# Patient Record
Sex: Female | Born: 1950 | Race: Black or African American | Hispanic: No | Marital: Married | Smoking: Former smoker
Health system: Southern US, Community
[De-identification: ages and names within clinical notes are randomized; demographics above are authoritative.]

## PROBLEM LIST (undated history)

## (undated) DIAGNOSIS — F419 Anxiety disorder, unspecified: Secondary | ICD-10-CM

## (undated) HISTORY — PX: BREAST EXCISIONAL BIOPSY: SUR124

## (undated) HISTORY — DX: Anxiety disorder, unspecified: F41.9

---

## 1991-12-14 HISTORY — PX: ABDOMINAL HYSTERECTOMY: SHX81

## 2010-12-13 DIAGNOSIS — I10 Essential (primary) hypertension: Secondary | ICD-10-CM

## 2010-12-13 DIAGNOSIS — E785 Hyperlipidemia, unspecified: Secondary | ICD-10-CM

## 2010-12-13 HISTORY — DX: Hyperlipidemia, unspecified: E78.5

## 2010-12-13 HISTORY — DX: Essential (primary) hypertension: I10

## 2015-12-14 DIAGNOSIS — C801 Malignant (primary) neoplasm, unspecified: Secondary | ICD-10-CM

## 2015-12-14 DIAGNOSIS — C50919 Malignant neoplasm of unspecified site of unspecified female breast: Secondary | ICD-10-CM

## 2015-12-14 DIAGNOSIS — Z923 Personal history of irradiation: Secondary | ICD-10-CM

## 2015-12-14 DIAGNOSIS — Z9221 Personal history of antineoplastic chemotherapy: Secondary | ICD-10-CM

## 2015-12-14 DIAGNOSIS — K219 Gastro-esophageal reflux disease without esophagitis: Secondary | ICD-10-CM

## 2015-12-14 HISTORY — DX: Gastro-esophageal reflux disease without esophagitis: K21.9

## 2015-12-14 HISTORY — PX: BREAST LUMPECTOMY: SHX2

## 2015-12-14 HISTORY — DX: Personal history of irradiation: Z92.3

## 2015-12-14 HISTORY — DX: Malignant (primary) neoplasm, unspecified: C80.1

## 2015-12-14 HISTORY — DX: Malignant neoplasm of unspecified site of unspecified female breast: C50.919

## 2015-12-14 HISTORY — DX: Personal history of antineoplastic chemotherapy: Z92.21

## 2018-02-03 DIAGNOSIS — R42 Dizziness and giddiness: Secondary | ICD-10-CM | POA: Insufficient documentation

## 2018-02-03 DIAGNOSIS — H9319 Tinnitus, unspecified ear: Secondary | ICD-10-CM | POA: Insufficient documentation

## 2019-12-14 DIAGNOSIS — M1990 Unspecified osteoarthritis, unspecified site: Secondary | ICD-10-CM

## 2019-12-14 HISTORY — DX: Unspecified osteoarthritis, unspecified site: M19.90

## 2020-07-28 ENCOUNTER — Telehealth: Payer: Self-pay | Admitting: Hematology and Oncology

## 2020-07-28 NOTE — Telephone Encounter (Signed)
Received records from [facility] for hx of breast cancer. Jennifer Chan has been cld and scheduled to see Dr. Lindi Adie on 8/24 at 3pm. Pt aware to arrive 15 minutes early.

## 2020-08-04 NOTE — Progress Notes (Signed)
[facility] CONSULT NOTE  Patient Care Team: System, Pcp Not In as PCP - General  CHIEF COMPLAINTS/PURPOSE OF CONSULTATION:  History of breast cancer  HISTORY OF PRESENTING ILLNESS:  Jennifer Chan 69 y.o. female is here because of a history of left breast cancer diagnosed in 2017. Patient palpated a left breast mass. 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 She underwent a left lumpectomy on 04/26/16 for which pathology showed invasive and in situ ductal carcinoma, 1.5cm, 3 axillary lymph nodes negative, positive margin resected on 09/14/16. She underwent adjuvant chemotherapy with weekly Taxol followed by Herceptin for a year. She began antiestrogen therapy with letrozole in January 2018, and eventually switched to anastrozole due to joint pain. She was under care at the [facility] in [place] and at [facility] in [place], [place]. She presents to the clinic today for initial evaluation.  Patient was living in [facility] and they decided to move to [place]  She denies any lumps or nodules in the breast.  I reviewed her records extensively and collaborated the history with the patient.  SUMMARY OF ONCOLOGIC HISTORY: Oncology History  Malignant neoplasm of upper-outer quadrant of left breast in female, estrogen receptor positive ([place])  04/26/2016 Initial Diagnosis   Patient palpated a left breast mass. 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 She underwent a left lumpectomy on 04/26/16 for which pathology showed invasive and in situ ductal carcinoma, 1.5cm, 3 axillary lymph nodes negative, positive margin resected on 09/14/16.    05/2016 - 05/2017 Chemotherapy   She underwent adjuvant chemotherapy with weekly Taxol followed by Herceptin for a year.    12/2016 -  Anti-estrogen oral therapy    She began antiestrogen therapy with letrozole in January 2018, and eventually switched to anastrozole due to joint pain. She was under care at the [facility] in [place] and at [facility] in [place], [place].     MEDICAL HISTORY:  Hypertension, hypercholesterolemia, breast cancer SURGICAL HISTORY: Breast conserving surgery SOCIAL HISTORY: Denies any tobacco alcohol or recreational drug use.  She quit smoking 12 years ago. FAMILY HISTORY: Mother died from Hodgkin's lymphoma.  ALLERGIES:  has no allergies on file.  MEDICATIONS:  Current Outpatient Medications  Medication Sig Dispense Refill  . amLODipine (NORVASC) 10 MG tablet Take 1 tablet (10 mg total) by mouth daily.    Marland Kitchen exemestane (AROMASIN) 25 MG tablet Take 1 tablet (25 mg total) by mouth daily after breakfast. 90 tablet 3  . metoprolol succinate (TOPROL-XL) 100 MG 24 hr tablet Take 1 tablet (100 mg total) by mouth daily. Take with or immediately following a meal.    . pravastatin (PRAVACHOL) 40 MG tablet Take 1 tablet (40 mg total) by mouth daily. 30 tablet   . Tragacanth (ASTRAGALUS ROOT) POWD 500 mg by Does not apply route daily.  0  . Vitamin D3 (VITAMIN D) 25 MCG tablet Take 1 tablet (1,000 Units total) by mouth daily. 60 tablet    No current facility-administered medications for this visit.    REVIEW OF SYSTEMS:   All other systems were reviewed with the patient and are negative.  PHYSICAL EXAMINATION: ECOG PERFORMANCE STATUS: 2 - Symptomatic, <50% confined to bed  Vitals:   08/05/20 1509  BP: (!) 163/56  Pulse: 68  Resp: 17  Temp: 97.7 F (  36.5 C)  SpO2: 99%   Filed Weights   08/05/20 1509  Weight: 153 lb 8 oz (69.6 kg)     BREAST: No palpable nodules in breast. No palpable axillary or supraclavicular lymphadenopathy (exam performed in the presence of a chaperone)   LABORATORY DATA:   RADIOGRAPHIC STUDIES: I have personally reviewed the radiological reports and agreed  with the findings in the report.  ASSESSMENT AND PLAN:  Malignant neoplasm of upper-outer quadrant of left breast in female, estrogen receptor positive ([place]) 03/19/2016: Palpable left breast mass revealed grade 2 IDC ER positive PR negative HER-2 3+ positive 04/26/2016: Left lumpectomy: IDC with DCIS 1.5 cm, 0/3 lymph nodes negative, positive margin resected on 09/14/2016 June 2017: Adjuvant chemo with Taxol Herceptin followed by Herceptin maintenance for 1 year January 2018: Initially letrozole switched to anastrozole (Treatment received at [facility] in [place] and [facility] in [place])  Current treatment: Anastrozole 1 mg daily Anastrozole toxicities: Denies any side effects to anastrozole  Breast cancer surveillance: 1.  Breast exam 08/05/2020: Benign 2. mammograms: July 2020 previous mammograms.  I will set her up at the breast center for her to get new mammograms this year.  Return to clinic in 1 year for follow-up. All questions were answered. The patient knows to call the clinic with any problems, questions or concerns.   Rulon Eisenmenger, MD, MPH 08/05/2020    I, Molly Dorshimer, am acting as scribe for Nicholas Lose, MD.  I have reviewed the above documentation for accuracy and completeness, and I agree with the above.

## 2020-08-05 ENCOUNTER — Other Ambulatory Visit: Payer: Self-pay

## 2020-08-05 ENCOUNTER — Inpatient Hospital Stay: Payer: Medicare HMO | Attending: Hematology and Oncology | Admitting: Hematology and Oncology

## 2020-08-05 DIAGNOSIS — C50412 Malignant neoplasm of upper-outer quadrant of left female breast: Secondary | ICD-10-CM

## 2020-08-05 DIAGNOSIS — I10 Essential (primary) hypertension: Secondary | ICD-10-CM | POA: Diagnosis not present

## 2020-08-05 DIAGNOSIS — E7800 Pure hypercholesterolemia, unspecified: Secondary | ICD-10-CM | POA: Insufficient documentation

## 2020-08-05 DIAGNOSIS — Z170 Estrogen receptor positive status [ER+]: Secondary | ICD-10-CM | POA: Diagnosis not present

## 2020-08-05 DIAGNOSIS — Z79811 Long term (current) use of aromatase inhibitors: Secondary | ICD-10-CM

## 2020-08-05 MED ORDER — VITAMIN D3 25 MCG PO TABS: 1000.0000 [IU] | ORAL_TABLET | Freq: Every day | ORAL | Status: AC

## 2020-08-05 MED ORDER — ASTRAGALUS ROOT POWD: 500.0000 mg | Freq: Every day | 0 refills | Status: AC

## 2020-08-05 MED ORDER — EXEMESTANE 25 MG PO TABS: 25.0000 mg | ORAL_TABLET | Freq: Every day | ORAL | 3 refills | Status: DC

## 2020-08-05 MED ORDER — AMLODIPINE BESYLATE 10 MG PO TABS: 10.0000 mg | ORAL_TABLET | Freq: Every day | ORAL | Status: AC

## 2020-08-05 MED ORDER — METOPROLOL SUCCINATE ER 100 MG PO TB24: 100.0000 mg | ORAL_TABLET | Freq: Every day | ORAL | Status: AC

## 2020-08-05 MED ORDER — PRAVASTATIN SODIUM 40 MG PO TABS: 40.0000 mg | ORAL_TABLET | Freq: Every day | ORAL | Status: AC

## 2020-08-05 NOTE — Assessment & Plan Note (Signed)
03/19/2016: Palpable left breast mass revealed grade 2 IDC ER positive PR negative HER-2 3+ positive 04/26/2016: Left lumpectomy: IDC with DCIS 1.5 cm, 0/3 lymph nodes negative, positive margin resected on 09/14/2016 June 2017: Adjuvant chemo with Taxol Herceptin followed by Herceptin maintenance for 1 year January 2018: Initially letrozole switched to anastrozole (Treatment received at [facility] in [place] and [facility] in [place])  Current treatment: Anastrozole 1 mg daily Anastrozole toxicities:  Breast cancer surveillance: 1.  Breast exam 08/05/2020: Benign 2. mammograms

## 2020-08-12 ENCOUNTER — Other Ambulatory Visit: Payer: Self-pay

## 2020-08-12 ENCOUNTER — Encounter: Payer: Self-pay | Admitting: Family Medicine

## 2020-08-12 ENCOUNTER — Ambulatory Visit (INDEPENDENT_AMBULATORY_CARE_PROVIDER_SITE_OTHER): Payer: Medicare HMO | Admitting: Family Medicine

## 2020-08-12 VITALS — BP 142/62 | HR 61 | Ht 64.0 in | Wt 151.8 lb

## 2020-08-12 DIAGNOSIS — E785 Hyperlipidemia, unspecified: Secondary | ICD-10-CM | POA: Diagnosis not present

## 2020-08-12 DIAGNOSIS — Z7689 Persons encountering health services in other specified circumstances: Secondary | ICD-10-CM

## 2020-08-12 DIAGNOSIS — I10 Essential (primary) hypertension: Secondary | ICD-10-CM | POA: Diagnosis not present

## 2020-08-12 DIAGNOSIS — E782 Mixed hyperlipidemia: Secondary | ICD-10-CM | POA: Diagnosis not present

## 2020-08-12 NOTE — Patient Instructions (Addendum)
It was wonderful to meet you today! I will call you with lab results. Please let me know if you're having any issues with sleeping or being exhausted at night.   Take your blood pressure at home at the same time everyday. If the top number is over 140 and the bottom number is over 90 consistently, please come back in to adjust your blood pressure medications.

## 2020-08-12 NOTE — Progress Notes (Signed)
New Patient Visit Subjective:  Care Team:  [place], MD [phone] [id number]/22/21) [place] [phone] (08/08/19)  Enrique Sack, PCP [phone] (2019)    CC: Establish care   HPI Jennifer Chan is a 69 y.o. female who presents today to establish care. She is new to the city and needed to establish care here. She also complains of feeling sore and tired at the end of the day for the last few months. She does report that she feels back to normal and refreshed in the morning.   HISTORY Reviewed with patient and updated in EMR as appropriate.  Allergies: none  Medications:  No Known Allergies  Current Outpatient Medications:  .  amLODipine (NORVASC) 10 MG tablet, Take 1 tablet (10 mg total) by mouth daily., Disp: , Rfl:  .  exemestane (AROMASIN) 25 MG tablet, Take 1 tablet (25 mg total) by mouth daily after breakfast., Disp: 90 tablet, Rfl: 3 .  metoprolol succinate (TOPROL-XL) 100 MG 24 hr tablet, Take 1 tablet (100 mg total) by mouth daily. Take with or immediately following a meal., Disp: , Rfl:  .  pravastatin (PRAVACHOL) 40 MG tablet, Take 1 tablet (40 mg total) by mouth daily., Disp: 30 tablet, Rfl:  .  Tragacanth (ASTRAGALUS ROOT) POWD, 500 mg by Does not apply route daily., Disp: , Rfl: 0 .  Vitamin D3 (VITAMIN D) 25 MCG tablet, Take 1 tablet (1,000 Units total) by mouth daily., Disp: 60 tablet, Rfl:   Past Medical History:  Past Medical History:  Diagnosis Date  . Arthritis 2021  . Cancer ([place]) 2017  . GERD (gastroesophageal reflux disease) 2017  . Hyperlipidemia 2012  . Hypertension 2012   Past Surgical History:  Procedure Laterality Date  . ABDOMINAL HYSTERECTOMY  1993   comlete   OB/Gyn History:  Hysterectomy 1993 LMP: No LMP recorded. OB History   No obstetric history on file.      Health Maintenance:  Colonoscopy 2012 Mammogram 2021 (normal) Vaccines: PCV 2019 (unsure is 2 doses) DEXA 2021  Echocardiogram  2017  Stress test 2017   Eye doctor: referral provided  Dentist: list of local dentists provided    Family History Emmalyn's family history includes Alcohol abuse in her brother, maternal grandfather, maternal grandmother, mother, paternal grandfather, paternal grandmother, and sister; Heart disease in her mother; Hyperlipidemia in her brother, father, mother, and sister; Hypertension in her brother, father, mother, and sister; Non-Hodgkin's lymphoma in her mother; Stroke in her brother.   Social History  Anneth's  has no history on file for tobacco use, alcohol use, and drug use..  Social History   Social History Narrative   Patient recently moved here from [facility] because she wanted to be in a more city-like area. She reports where she used to live, she had to drive hours to get to a store.  She lives with her husband Jennifer Chan).  She owns a car.  She does not have any religious or personal beliefs that affect her health care.  She does not have an advanced directive.  Her husband and brothers would make medical decisions for her if she was unable to do so.  She does exercise regularly: Walking.  She used to use cigarettes and quit in January 2012.  She denies any recreational drug use and alcohol use.  She feels safe in her relationship.  .  Objective:  Physical Exam:  BP (!) 142/62   Pulse 61   Ht  5\' 4"  (1.626 m)   Wt 151 lb 12.8 oz (68.9 kg)   SpO2 97%   BMI 26.06 kg/m   Gen: NAD, alert, non-toxic, pleasant HEENT: Normocephaic, atraumatic. PERRLA, clear conjuctiva, no scleral icterus and injection. Normal EOM.  Hearing intact. TM pearly grey bilaterally with no fluid. Neck supple with no LAD, nodules, or gross abnormality.  Nares patent with no discharge.  Oropharynx without erythema and lesions.  Tonsils nonswollen and without exudate.   CV: Regular rate and rhythm.  Normal S1-S2.  No murmur, gallops, S3, S4 appreciated.  Normal capillary refill  bilaterally.  Radial pulses 2+ bilaterally. No bilateral lower extremity edema. Resp: Clear to auscultation bilaterally.  No wheezing, rales, rhonchi, or other abnormal lung sounds.  No increased work of breathing appreciated. Abd: Nontender and nondistended on palpation to all 4 quadrants.  Positive bowel sounds. Skin: No obvious rashes, lesions, or trauma.  Normal turgor.  MSK: Normal ROM. Normal strength and tone.  Neuro: Cranial nerves II through VI grossly intact. Gait normal.  Alert and oriented x4.  No obvious abnormal movements. Psych: Cooperative with exam.  Normal speech. Pleasant. Makes good eye contact. Genitourinary: deferred.   Assessment & Plan:  Encounter to establish care Reassured patient about tiredness and soreness at the end of the day. Likely due to recent move. Reassuring that patient feels back to normal in the mornings. Follow up if no improvement or worsening.   Hyperlipidemia Checking lipid panel today.  Patient taking pravastatin 40 mg daily.  Hypertension Blood pressure slightly elevated at 142/62 today.  Discussed checking blood pressures at home at the same time every day.  Patient will write down and if greater than 140/90, patient to follow-up.  Currently taking amlodipine 10 mg and metoprolol XL 100 mg daily.  No refills needed at this time.   Will update health maintenance once records are received.  Follow up:  Future Appointments  Date Time Provider [place]  08/05/2021  2:45 PM Nicholas Lose, MD [facility] None    Wilber Oliphant, MD 08/14/20, 5:05 AM

## 2020-08-13 LAB — LIPID PANEL
Chol/HDL Ratio: 3.3 ratio (ref 0.0–4.4)
Cholesterol, Total: 126 mg/dL (ref 100–199)
HDL: 38 mg/dL — ABNORMAL LOW (ref >39)
LDL Chol Calc (NIH): 65 mg/dL (ref 0–99)
Triglycerides: 131 mg/dL (ref 0–149)
VLDL Cholesterol Cal: 23 mg/dL (ref 5–40)

## 2020-08-14 ENCOUNTER — Encounter: Payer: Self-pay | Admitting: Family Medicine

## 2020-08-14 DIAGNOSIS — I10 Essential (primary) hypertension: Secondary | ICD-10-CM | POA: Insufficient documentation

## 2020-08-14 DIAGNOSIS — E785 Hyperlipidemia, unspecified: Secondary | ICD-10-CM | POA: Insufficient documentation

## 2020-08-14 DIAGNOSIS — Z7689 Persons encountering health services in other specified circumstances: Secondary | ICD-10-CM | POA: Insufficient documentation

## 2020-08-14 NOTE — Assessment & Plan Note (Signed)
Reassured patient about tiredness and soreness at the end of the day. Likely due to recent move. Reassuring that patient feels back to normal in the mornings. Follow up if no improvement or worsening.

## 2020-08-14 NOTE — Assessment & Plan Note (Signed)
Blood pressure slightly elevated at 142/62 today.  Discussed checking blood pressures at home at the same time every day.  Patient will write down and if greater than 140/90, patient to follow-up.  Currently taking amlodipine 10 mg and metoprolol XL 100 mg daily.  No refills needed at this time.

## 2020-08-14 NOTE — Assessment & Plan Note (Addendum)
Checking lipid panel today.  Patient taking pravastatin 40 mg daily.

## 2020-09-29 ENCOUNTER — Other Ambulatory Visit: Payer: Self-pay

## 2020-09-29 ENCOUNTER — Ambulatory Visit
Admission: RE | Admit: 2020-09-29 | Discharge: 2020-09-29 | Disposition: A | Discharge status: Home / Self Care | Payer: Medicare PPO | Source: Ambulatory Visit | Attending: Hematology and Oncology | Admitting: Hematology and Oncology

## 2020-09-29 DIAGNOSIS — C50412 Malignant neoplasm of upper-outer quadrant of left female breast: Secondary | ICD-10-CM

## 2020-09-29 DIAGNOSIS — Z170 Estrogen receptor positive status [ER+]: Secondary | ICD-10-CM

## 2020-09-30 ENCOUNTER — Ambulatory Visit: Payer: Medicare PPO

## 2020-10-07 ENCOUNTER — Ambulatory Visit: Payer: Medicare PPO

## 2020-10-10 ENCOUNTER — Ambulatory Visit (INDEPENDENT_AMBULATORY_CARE_PROVIDER_SITE_OTHER): Payer: Medicare PPO

## 2020-10-10 ENCOUNTER — Other Ambulatory Visit: Payer: Self-pay

## 2020-10-10 DIAGNOSIS — Z23 Encounter for immunization: Secondary | ICD-10-CM | POA: Diagnosis not present

## 2020-10-10 NOTE — Progress Notes (Signed)
Patient presents in Flu Clinic for Flu Vaccine.   Vaccine administered RD without complications.   See admin for details.

## 2021-01-20 DIAGNOSIS — H02822 Cysts of right lower eyelid: Secondary | ICD-10-CM | POA: Diagnosis not present

## 2021-01-20 DIAGNOSIS — H524 Presbyopia: Secondary | ICD-10-CM | POA: Diagnosis not present

## 2021-01-20 DIAGNOSIS — H5203 Hypermetropia, bilateral: Secondary | ICD-10-CM | POA: Diagnosis not present

## 2021-02-05 DIAGNOSIS — I10 Essential (primary) hypertension: Secondary | ICD-10-CM | POA: Diagnosis not present

## 2021-02-05 DIAGNOSIS — C50912 Malignant neoplasm of unspecified site of left female breast: Secondary | ICD-10-CM | POA: Diagnosis not present

## 2021-02-05 DIAGNOSIS — H4010X0 Unspecified open-angle glaucoma, stage unspecified: Secondary | ICD-10-CM | POA: Diagnosis not present

## 2021-02-05 DIAGNOSIS — E785 Hyperlipidemia, unspecified: Secondary | ICD-10-CM | POA: Diagnosis not present

## 2021-02-16 DIAGNOSIS — Z1211 Encounter for screening for malignant neoplasm of colon: Secondary | ICD-10-CM | POA: Diagnosis not present

## 2021-02-17 DIAGNOSIS — D485 Neoplasm of uncertain behavior of skin: Secondary | ICD-10-CM | POA: Diagnosis not present

## 2021-02-17 DIAGNOSIS — B078 Other viral warts: Secondary | ICD-10-CM | POA: Diagnosis not present

## 2021-03-02 DIAGNOSIS — H269 Unspecified cataract: Secondary | ICD-10-CM | POA: Diagnosis not present

## 2021-06-17 DIAGNOSIS — E782 Mixed hyperlipidemia: Secondary | ICD-10-CM | POA: Diagnosis not present

## 2021-06-17 DIAGNOSIS — M545 Low back pain, unspecified: Secondary | ICD-10-CM | POA: Diagnosis not present

## 2021-06-17 DIAGNOSIS — I10 Essential (primary) hypertension: Secondary | ICD-10-CM | POA: Diagnosis not present

## 2021-07-02 ENCOUNTER — Other Ambulatory Visit: Payer: Self-pay

## 2021-07-02 ENCOUNTER — Encounter (HOSPITAL_BASED_OUTPATIENT_CLINIC_OR_DEPARTMENT_OTHER): Payer: Self-pay | Admitting: *Deleted

## 2021-07-02 ENCOUNTER — Ambulatory Visit (HOSPITAL_COMMUNITY)
Admission: EM | Admit: 2021-07-02 | Discharge: 2021-07-02 | Disposition: A | Discharge status: Home / Self Care | Payer: Medicare HMO

## 2021-07-02 ENCOUNTER — Emergency Department (HOSPITAL_BASED_OUTPATIENT_CLINIC_OR_DEPARTMENT_OTHER)
Admission: EM | Admit: 2021-07-02 | Discharge: 2021-07-02 | Disposition: A | Discharge status: Home / Self Care | Payer: Medicare HMO | Attending: Emergency Medicine | Admitting: Emergency Medicine

## 2021-07-02 DIAGNOSIS — Z853 Personal history of malignant neoplasm of breast: Secondary | ICD-10-CM | POA: Diagnosis not present

## 2021-07-02 DIAGNOSIS — I10 Essential (primary) hypertension: Secondary | ICD-10-CM | POA: Diagnosis not present

## 2021-07-02 DIAGNOSIS — Z23 Encounter for immunization: Secondary | ICD-10-CM | POA: Diagnosis not present

## 2021-07-02 DIAGNOSIS — Z87891 Personal history of nicotine dependence: Secondary | ICD-10-CM | POA: Diagnosis not present

## 2021-07-02 DIAGNOSIS — W260XXA Contact with knife, initial encounter: Secondary | ICD-10-CM | POA: Insufficient documentation

## 2021-07-02 DIAGNOSIS — S6991XA Unspecified injury of right wrist, hand and finger(s), initial encounter: Secondary | ICD-10-CM | POA: Diagnosis present

## 2021-07-02 DIAGNOSIS — Z79899 Other long term (current) drug therapy: Secondary | ICD-10-CM | POA: Insufficient documentation

## 2021-07-02 DIAGNOSIS — S61212A Laceration without foreign body of right middle finger without damage to nail, initial encounter: Secondary | ICD-10-CM | POA: Insufficient documentation

## 2021-07-02 MED ORDER — TETANUS-DIPHTH-ACELL PERTUSSIS 5-2.5-18.5 LF-MCG/0.5 IM SUSY
0.5000 mL | PREFILLED_SYRINGE | Freq: Once | INTRAMUSCULAR | Status: AC
  Administered 2021-07-02: 0.5 mL via INTRAMUSCULAR
  Filled 2021-07-02: qty 0.5

## 2021-07-02 MED ORDER — LIDOCAINE-EPINEPHRINE (PF) 2 %-1:200000 IJ SOLN
20.0000 mL | Freq: Once | INTRAMUSCULAR | Status: AC
  Administered 2021-07-02: 20 mL
  Filled 2021-07-02: qty 20

## 2021-07-02 NOTE — ED Provider Notes (Signed)
[facility] EMERGENCY DEPT Provider Note   CSN: [id number] Arrival date & time: 07/02/21  1808     History Chief Complaint  Patient presents with   Finger Laceration    Jennifer Chan is a 70 y.o. female.  Patient c/o accidental laceration to right middle finger just pta today. Was reaching in drawer and distal phalanx was cut on sharp edge of knife. Patient w narrow-based flap to palmer aspect right middle finger distal phalanx. Minor bleeding stopped w pressure. Moderate, sharp, non radiating pain to area, constant. Denies other pain or injury. Last tetanus unknown. Is right hand dominant.   The history is provided by the patient.      Past Medical History:  Diagnosis Date   Arthritis 2021   Breast cancer ([place]) 2017   Left Breast Cancer   Cancer ([place]) 2017   GERD (gastroesophageal reflux disease) 2017   Hyperlipidemia 2012   Hypertension 2012   Personal history of chemotherapy 2017   Left Breast Cancer   Personal history of radiation therapy 2017   Left Breast Cancer    Patient Active Problem List   Diagnosis Date Noted   Encounter to establish care 08/14/2020   Hypertension    Hyperlipidemia    Malignant neoplasm of upper-outer quadrant of left breast in female, estrogen receptor positive ([place]) 08/05/2020    Past Surgical History:  Procedure Laterality Date   ABDOMINAL HYSTERECTOMY  1993   comlete   BREAST EXCISIONAL BIOPSY Left    BREAST EXCISIONAL BIOPSY Left    BREAST LUMPECTOMY Left 2017     OB History     Gravida  4   Para  2   Term      Preterm  1   AB  1   Living         SAB      IAB      Ectopic      Multiple      Live Births              Family History  Problem Relation Age of Onset   Alcohol abuse Mother    Non-Hodgkin's lymphoma Mother    Heart disease Mother    Hyperlipidemia Mother    Hypertension Mother    Hyperlipidemia Father    Hypertension Father    Alcohol abuse Sister     Hyperlipidemia Sister    Hypertension Sister    Alcohol abuse Brother    Hyperlipidemia Brother    Hypertension Brother    Stroke Brother    Alcohol abuse Maternal Grandmother    Alcohol abuse Maternal Grandfather    Alcohol abuse Paternal Grandmother    Alcohol abuse Paternal Grandfather     Social History   Tobacco Use   Smoking status: Former    Types: Cigarettes    Quit date: 12/14/2010    Years since quitting: 10.5   Smokeless tobacco: Never  Vaping Use   Vaping Use: Never used  Substance Use Topics   Alcohol use: Never   Drug use: Never    Home Medications Prior to Admission medications   Medication Sig Start Date End Date Taking? Authorizing Provider  amLODipine (NORVASC) 10 MG tablet Take 1 tablet (10 mg total) by mouth daily. 08/05/20  Yes Nicholas Lose, MD  exemestane (AROMASIN) 25 MG tablet Take 1 tablet (25 mg total) by mouth daily after breakfast. 08/05/20  Yes Nicholas Lose, MD  metoprolol succinate (TOPROL-XL) 100 MG 24 hr tablet Take 1 tablet (  100 mg total) by mouth daily. Take with or immediately following a meal. 08/05/20  Yes Nicholas Lose, MD  pravastatin (PRAVACHOL) 40 MG tablet Take 1 tablet (40 mg total) by mouth daily. 08/05/20  Yes Nicholas Lose, MD  Vitamin D3 (VITAMIN D) 25 MCG tablet Take 1 tablet (1,000 Units total) by mouth daily. 08/05/20  Yes Nicholas Lose, MD  Tragacanth (ASTRAGALUS ROOT) POWD 500 mg by Does not apply route daily. 08/05/20   Nicholas Lose, MD    Allergies    Patient has no known allergies.  Review of Systems   Review of Systems  Constitutional:  Negative for fever.  Musculoskeletal:        Finger tip injury  Skin:  Positive for wound.  Neurological:  Negative for numbness.   Physical Exam Updated Vital Signs BP (!) 185/63 (BP Location: Right Arm)   Pulse 83   Temp 98.2 F (36.8 C) (Oral)   Resp 20   Ht 1.6 m (5\' 3" )   Wt 72.6 kg   LMP  (LMP Unknown)   SpO2 97%   BMI 28.34 kg/m   Physical Exam Vitals and nursing  note reviewed.  Constitutional:      Appearance: Normal appearance. She is well-developed.  HENT:     Head: Atraumatic.     Nose: Nose normal.     Mouth/Throat:     Mouth: Mucous membranes are moist.  Eyes:     General: No scleral icterus.    Conjunctiva/sclera: Conjunctivae normal.  Neck:     Trachea: No tracheal deviation.  Cardiovascular:     Rate and Rhythm: Normal rate.     Pulses: Normal pulses.  Pulmonary:     Effort: Pulmonary effort is normal. No respiratory distress.  Genitourinary:    Comments: No cva tenderness.  Musculoskeletal:        General: No swelling.     Cervical back: Neck supple. No muscular tenderness.     Comments: Narrow-based flap (~2 mm on ulnar side of flap) type laceration to palmer aspect of distal phalanx/distal tip of right middle finger. Skin of flap is very pale. Nail intact. Normal flexon of digit.   Skin:    General: Skin is warm and dry.     Findings: No rash.  Neurological:     Mental Status: She is alert.     Comments: Alert, speech normal. Hand nvi.   Psychiatric:        Mood and Affect: Mood normal.    ED Results / Procedures / Treatments   Labs (all labs ordered are listed, but only abnormal results are displayed) Labs Reviewed - No data to display  EKG None  Radiology No results found.  Procedures .Marland KitchenLaceration Repair  Date/Time: 07/02/2021 11:27 PM Performed by: Lajean Saver, MD Authorized by: Lajean Saver, MD   Consent:    Consent given by:  Patient Anesthesia:    Anesthesia method:  Nerve block   Block location:  Digit block   Block anesthetic:  Lidocaine 2% w/o epi   Block technique:  Digit   Block outcome:  Anesthesia achieved Laceration details:    Location:  Finger   Finger location:  R long finger   Length (cm):  3 Exploration:    Wound exploration: entire depth of wound visualized     Wound extent: no foreign bodies/material noted     Contaminated: no   Treatment:    Area cleansed with:  Saline    Amount of cleaning:  Standard  Irrigation solution:  Sterile saline   Visualized foreign bodies/material removed: no     Debridement:  None   Undermining:  None Skin repair:    Repair method:  Sutures   Suture size:  4-0   Suture material:  Prolene   Suture technique:  Simple interrupted   Number of sutures:  6 Repair type:    Repair type:  Simple Post-procedure details:    Dressing:  Sterile dressing   Procedure completion:  Tolerated well, no immediate complications   Medications Ordered in ED Medications  lidocaine-EPINEPHrine (XYLOCAINE W/EPI) 2 %-1:200000 (PF) injection 20 mL (has no administration in time range)  Tdap (BOOSTRIX) injection 0.5 mL (has no administration in time range)    ED Course  I have reviewed the triage vital signs and the nursing notes.  Pertinent labs & imaging results that were available during my care of the patient were reviewed by me and considered in my medical decision making (see chart for details).    MDM Rules/Calculators/A&P                          Tetanus IM.  Reviewed nursing notes and prior charts for additional history.   Discussed w pt, that give narrow flap, pallor of skin, etc, that flap/skin may well die, and that I do recommend closure of wound/suturing. Pt is agreeable.   Wound sutured. Sterile dressing. Pt tolerated well.    Final Clinical Impression(s) / ED Diagnoses Final diagnoses:  None    Rx / DC Orders ED Discharge Orders     None        Lajean Saver, MD 07/02/21 2330

## 2021-07-02 NOTE — Discharge Instructions (Signed)
It was our pleasure to provide your ER care today - we hope that you feel better.  As we discussed, given the flap nature of wound w very narrow attachment to finger, the skin/flap may not survive, but in interim, will provide a good dressing to open wound.   Elevate hand/finger. Keep area very clean/dry. Take acetaminophen as need.   Have sutures removed, your doctor or urgent care, in one week.   Also, your blood pressure is high tonight - continue your meds, and follow up with primary care doctor in 1-2 weeks.   Return to ER if worse, new symptoms, severe pain, infection of wound (pus, spreading redness), or other concern.

## 2021-07-02 NOTE — ED Triage Notes (Signed)
Right middle finger laceration today.

## 2021-07-14 DIAGNOSIS — Z4802 Encounter for removal of sutures: Secondary | ICD-10-CM | POA: Diagnosis not present

## 2021-07-14 DIAGNOSIS — I10 Essential (primary) hypertension: Secondary | ICD-10-CM | POA: Diagnosis not present

## 2021-07-20 DIAGNOSIS — S6991XD Unspecified injury of right wrist, hand and finger(s), subsequent encounter: Secondary | ICD-10-CM | POA: Diagnosis not present

## 2021-07-20 DIAGNOSIS — I10 Essential (primary) hypertension: Secondary | ICD-10-CM | POA: Diagnosis not present

## 2021-07-20 DIAGNOSIS — Z4802 Encounter for removal of sutures: Secondary | ICD-10-CM | POA: Diagnosis not present

## 2021-07-21 DIAGNOSIS — S61212A Laceration without foreign body of right middle finger without damage to nail, initial encounter: Secondary | ICD-10-CM | POA: Diagnosis not present

## 2021-08-04 NOTE — Progress Notes (Signed)
Patient Care Team: System, Provider Not In as PCP - General  DIAGNOSIS:    ICD-10-CM   1. Malignant neoplasm of upper-outer quadrant of left breast in female, estrogen receptor positive ([place])  C50.412    Z17.0       SUMMARY OF ONCOLOGIC HISTORY: Oncology History  Malignant neoplasm of upper-outer quadrant of left breast in female, estrogen receptor positive ([place])  04/26/2016 Initial Diagnosis   Patient palpated a left breast mass. 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 She underwent a left lumpectomy on 04/26/16 for which pathology showed invasive and in situ ductal carcinoma, 1.5cm, 3 axillary lymph nodes negative, positive margin resected on 09/14/16.    05/2016 - 05/2017 Chemotherapy   She underwent adjuvant chemotherapy with weekly Taxol followed by Herceptin for a year.    12/2016 -  Anti-estrogen oral therapy   She began antiestrogen therapy with letrozole in January 2018, and eventually switched to anastrozole due to joint pain. She was under care at the [facility] in [place] and at [facility] in [place], [place].     CHIEF COMPLIANT: Follow-up of left breast cancer  INTERVAL HISTORY: Jennifer Chan is a 70 y.o. with above-mentioned history of left breast cancer. She presents to the clinic today for follow-up.  She has been on exemestane therapy and tells me that it is very expensive.  She has been tolerating it fairly well with exception of joint aches and stiffness.  ALLERGIES:  has No Known Allergies.  MEDICATIONS:  Current Outpatient Medications  Medication Sig Dispense Refill   amLODipine (NORVASC) 10 MG tablet Take 1 tablet (10 mg total) by mouth daily.     exemestane (AROMASIN) 25 MG tablet Take 1 tablet (25 mg total) by mouth daily after breakfast. 90 tablet 3   metoprolol succinate (TOPROL-XL) 100 MG 24 hr tablet Take 1 tablet (100 mg total) by mouth daily. Take with or immediately  following a meal.     pravastatin (PRAVACHOL) 40 MG tablet Take 1 tablet (40 mg total) by mouth daily. 30 tablet    Tragacanth (ASTRAGALUS ROOT) POWD 500 mg by Does not apply route daily.  0   Vitamin D3 (VITAMIN D) 25 MCG tablet Take 1 tablet (1,000 Units total) by mouth daily. 60 tablet    No current facility-administered medications for this visit.    PHYSICAL EXAMINATION: ECOG PERFORMANCE STATUS: 1 - Symptomatic but completely ambulatory  Vitals:   08/05/21 1458  BP: (!) 172/53  Pulse: 76  Resp: 18  Temp: 98.3 F (36.8 C)  SpO2: 100%   Filed Weights   08/05/21 1458  Weight: 158 lb 8 oz (71.9 kg)    BREAST: No palpable masses or nodules in either right or left breasts. No palpable axillary supraclavicular or infraclavicular adenopathy no breast tenderness or nipple discharge. (exam performed in the presence of a chaperone)  ASSESSMENT & PLAN:  Malignant neoplasm of upper-outer quadrant of left breast in female, estrogen receptor positive ([place]) 03/19/2016: Palpable left breast mass revealed grade 2 IDC ER positive PR negative HER-2 3+ positive 04/26/2016: Left lumpectomy: IDC with DCIS 1.5 cm, 0/3 lymph nodes negative, positive margin resected on 09/14/2016 June 2017: Adjuvant chemo with Taxol Herceptin followed by Herceptin maintenance for 1 year January 2018: Initially letrozole switched to anastrozole switched to exemestane (Treatment received at [facility] in [place] and [facility] in [place])   Current treatment: Currently  on exemestane but we will be switching her to letrozole because of high cost of exemestane.  I recommended that she continue this for 2 more years.     Breast cancer surveillance: 1.  Breast exam 08/05/2021: Benign 2. mammograms: 09/29/2020: Benign breast density category B.   Return to clinic in 1 year for follow-up.    No orders of the defined types were placed in this encounter.  The patient  has a good understanding of the overall plan. she agrees with it. she will call with any problems that may develop before the next visit here.  Total time spent: 20 mins including face to face time and time spent for planning, charting and coordination of care  Rulon Eisenmenger, MD, MPH 08/05/2021  I, Thana Ates, am acting as scribe for Dr. Nicholas Lose.  I have reviewed the above documentation for accuracy and completeness, and I agree with the above.

## 2021-08-04 NOTE — Assessment & Plan Note (Signed)
03/19/2016: Palpable left breast mass revealed grade 2 IDC ER positive PR negative HER-2 3+ positive 04/26/2016: Left lumpectomy: IDC with DCIS 1.5 cm, 0/3 lymph nodes negative, positive margin resected on 09/14/2016 June 2017: Adjuvant chemo with Taxol Herceptin followed by Herceptin maintenance for 1 year January 2018: Initially letrozole switched to anastrozole (Treatment received at [facility] in [place] and [facility] in [place])  Current treatment: Anastrozole 1 mg daily Anastrozole toxicities: Denies any side effects to anastrozole  Breast cancer surveillance: 1.  Breast exam 08/05/2021: Benign 2. mammograms: 09/29/2020: Benign breast density category B.  Return to clinic in 1 year for follow-up.

## 2021-08-05 ENCOUNTER — Other Ambulatory Visit: Payer: Self-pay

## 2021-08-05 ENCOUNTER — Inpatient Hospital Stay: Payer: Medicare HMO | Attending: Hematology and Oncology | Admitting: Hematology and Oncology

## 2021-08-05 DIAGNOSIS — C50412 Malignant neoplasm of upper-outer quadrant of left female breast: Secondary | ICD-10-CM | POA: Diagnosis not present

## 2021-08-05 DIAGNOSIS — Z79811 Long term (current) use of aromatase inhibitors: Secondary | ICD-10-CM | POA: Insufficient documentation

## 2021-08-05 DIAGNOSIS — Z170 Estrogen receptor positive status [ER+]: Secondary | ICD-10-CM | POA: Diagnosis not present

## 2021-08-05 MED ORDER — LETROZOLE 2.5 MG PO TABS: 2.5000 mg | ORAL_TABLET | Freq: Every day | ORAL | 3 refills | Status: DC

## 2021-08-09 ENCOUNTER — Other Ambulatory Visit: Payer: Self-pay | Admitting: Hematology and Oncology

## 2021-08-11 DIAGNOSIS — S61212A Laceration without foreign body of right middle finger without damage to nail, initial encounter: Secondary | ICD-10-CM | POA: Diagnosis not present

## 2021-08-18 DIAGNOSIS — E782 Mixed hyperlipidemia: Secondary | ICD-10-CM | POA: Diagnosis not present

## 2021-08-18 DIAGNOSIS — I10 Essential (primary) hypertension: Secondary | ICD-10-CM | POA: Diagnosis not present

## 2021-08-20 ENCOUNTER — Other Ambulatory Visit: Payer: Self-pay | Admitting: Hematology and Oncology

## 2021-08-20 ENCOUNTER — Other Ambulatory Visit: Payer: Self-pay | Admitting: Internal Medicine

## 2021-08-20 DIAGNOSIS — Z1231 Encounter for screening mammogram for malignant neoplasm of breast: Secondary | ICD-10-CM

## 2021-08-25 DIAGNOSIS — R7303 Prediabetes: Secondary | ICD-10-CM | POA: Diagnosis not present

## 2021-08-25 DIAGNOSIS — E782 Mixed hyperlipidemia: Secondary | ICD-10-CM | POA: Diagnosis not present

## 2021-08-25 DIAGNOSIS — Z23 Encounter for immunization: Secondary | ICD-10-CM | POA: Diagnosis not present

## 2021-08-25 DIAGNOSIS — E049 Nontoxic goiter, unspecified: Secondary | ICD-10-CM | POA: Diagnosis not present

## 2021-08-25 DIAGNOSIS — H8110 Benign paroxysmal vertigo, unspecified ear: Secondary | ICD-10-CM | POA: Diagnosis not present

## 2021-08-25 DIAGNOSIS — I10 Essential (primary) hypertension: Secondary | ICD-10-CM | POA: Diagnosis not present

## 2021-08-25 DIAGNOSIS — Z0000 Encounter for general adult medical examination without abnormal findings: Secondary | ICD-10-CM | POA: Diagnosis not present

## 2021-10-05 ENCOUNTER — Other Ambulatory Visit: Payer: Self-pay

## 2021-10-05 ENCOUNTER — Ambulatory Visit
Admission: RE | Admit: 2021-10-05 | Discharge: 2021-10-05 | Disposition: A | Discharge status: Home / Self Care | Payer: Medicare HMO | Source: Ambulatory Visit | Attending: Internal Medicine | Admitting: Internal Medicine

## 2021-10-05 DIAGNOSIS — Z1231 Encounter for screening mammogram for malignant neoplasm of breast: Secondary | ICD-10-CM | POA: Diagnosis not present

## 2022-01-05 DIAGNOSIS — Z87891 Personal history of nicotine dependence: Secondary | ICD-10-CM | POA: Diagnosis not present

## 2022-01-05 DIAGNOSIS — I10 Essential (primary) hypertension: Secondary | ICD-10-CM | POA: Diagnosis not present

## 2022-01-05 DIAGNOSIS — Z803 Family history of malignant neoplasm of breast: Secondary | ICD-10-CM | POA: Diagnosis not present

## 2022-01-05 DIAGNOSIS — H269 Unspecified cataract: Secondary | ICD-10-CM | POA: Diagnosis not present

## 2022-01-05 DIAGNOSIS — Z7722 Contact with and (suspected) exposure to environmental tobacco smoke (acute) (chronic): Secondary | ICD-10-CM | POA: Diagnosis not present

## 2022-01-05 DIAGNOSIS — E785 Hyperlipidemia, unspecified: Secondary | ICD-10-CM | POA: Diagnosis not present

## 2022-01-05 DIAGNOSIS — K08109 Complete loss of teeth, unspecified cause, unspecified class: Secondary | ICD-10-CM | POA: Diagnosis not present

## 2022-01-05 DIAGNOSIS — Z8249 Family history of ischemic heart disease and other diseases of the circulatory system: Secondary | ICD-10-CM | POA: Diagnosis not present

## 2022-01-05 DIAGNOSIS — Z823 Family history of stroke: Secondary | ICD-10-CM | POA: Diagnosis not present

## 2022-01-05 DIAGNOSIS — C50919 Malignant neoplasm of unspecified site of unspecified female breast: Secondary | ICD-10-CM | POA: Diagnosis not present

## 2022-01-05 DIAGNOSIS — Z79811 Long term (current) use of aromatase inhibitors: Secondary | ICD-10-CM | POA: Diagnosis not present

## 2022-01-05 DIAGNOSIS — Z833 Family history of diabetes mellitus: Secondary | ICD-10-CM | POA: Diagnosis not present

## 2022-02-17 DIAGNOSIS — E782 Mixed hyperlipidemia: Secondary | ICD-10-CM | POA: Diagnosis not present

## 2022-02-17 DIAGNOSIS — I10 Essential (primary) hypertension: Secondary | ICD-10-CM | POA: Diagnosis not present

## 2022-02-17 DIAGNOSIS — R7303 Prediabetes: Secondary | ICD-10-CM | POA: Diagnosis not present

## 2022-02-24 DIAGNOSIS — E049 Nontoxic goiter, unspecified: Secondary | ICD-10-CM | POA: Diagnosis not present

## 2022-02-24 DIAGNOSIS — I10 Essential (primary) hypertension: Secondary | ICD-10-CM | POA: Diagnosis not present

## 2022-02-24 DIAGNOSIS — M79601 Pain in right arm: Secondary | ICD-10-CM | POA: Diagnosis not present

## 2022-02-24 DIAGNOSIS — R7303 Prediabetes: Secondary | ICD-10-CM | POA: Diagnosis not present

## 2022-02-24 DIAGNOSIS — S46001A Unspecified injury of muscle(s) and tendon(s) of the rotator cuff of right shoulder, initial encounter: Secondary | ICD-10-CM | POA: Diagnosis not present

## 2022-02-24 DIAGNOSIS — E782 Mixed hyperlipidemia: Secondary | ICD-10-CM | POA: Diagnosis not present

## 2022-02-25 ENCOUNTER — Other Ambulatory Visit: Payer: Self-pay | Admitting: Internal Medicine

## 2022-03-04 ENCOUNTER — Ambulatory Visit
Admission: RE | Admit: 2022-03-04 | Discharge: 2022-03-04 | Disposition: A | Discharge status: Home / Self Care | Payer: Medicare HMO | Source: Ambulatory Visit | Attending: Internal Medicine | Admitting: Internal Medicine

## 2022-03-04 DIAGNOSIS — E010 Iodine-deficiency related diffuse (endemic) goiter: Secondary | ICD-10-CM | POA: Diagnosis not present

## 2022-03-04 DIAGNOSIS — E041 Nontoxic single thyroid nodule: Secondary | ICD-10-CM | POA: Diagnosis not present

## 2022-03-04 DIAGNOSIS — E049 Nontoxic goiter, unspecified: Secondary | ICD-10-CM

## 2022-03-10 DIAGNOSIS — E041 Nontoxic single thyroid nodule: Secondary | ICD-10-CM | POA: Diagnosis not present

## 2022-03-23 DIAGNOSIS — Z111 Encounter for screening for respiratory tuberculosis: Secondary | ICD-10-CM | POA: Diagnosis not present

## 2022-03-25 DIAGNOSIS — Z111 Encounter for screening for respiratory tuberculosis: Secondary | ICD-10-CM | POA: Diagnosis not present

## 2022-04-28 DIAGNOSIS — R7303 Prediabetes: Secondary | ICD-10-CM | POA: Diagnosis not present

## 2022-04-28 DIAGNOSIS — E782 Mixed hyperlipidemia: Secondary | ICD-10-CM | POA: Diagnosis not present

## 2022-04-28 DIAGNOSIS — I10 Essential (primary) hypertension: Secondary | ICD-10-CM | POA: Diagnosis not present

## 2022-05-20 DIAGNOSIS — I10 Essential (primary) hypertension: Secondary | ICD-10-CM | POA: Diagnosis not present

## 2022-05-20 DIAGNOSIS — R7303 Prediabetes: Secondary | ICD-10-CM | POA: Diagnosis not present

## 2022-07-13 ENCOUNTER — Telehealth: Payer: Self-pay | Admitting: Hematology and Oncology

## 2022-07-13 NOTE — Telephone Encounter (Signed)
Scheduled appointment per provider PAL. Patient is aware of the changes made to her upcoming appointment.  

## 2022-07-24 ENCOUNTER — Other Ambulatory Visit: Payer: Self-pay | Admitting: Hematology and Oncology

## 2022-08-05 ENCOUNTER — Inpatient Hospital Stay: Payer: Medicare HMO | Admitting: Hematology and Oncology

## 2022-08-09 ENCOUNTER — Inpatient Hospital Stay: Payer: Medicare HMO | Admitting: Hematology and Oncology

## 2022-08-10 ENCOUNTER — Telehealth: Payer: Self-pay | Admitting: Hematology and Oncology

## 2022-08-10 NOTE — Telephone Encounter (Signed)
Rescheduled appointment per patient via 8/28 staff message. Patient is aware of the changes made to her upcoming appointment.

## 2022-08-12 DIAGNOSIS — H40013 Open angle with borderline findings, low risk, bilateral: Secondary | ICD-10-CM | POA: Diagnosis not present

## 2022-08-12 DIAGNOSIS — H25813 Combined forms of age-related cataract, bilateral: Secondary | ICD-10-CM | POA: Diagnosis not present

## 2022-08-23 ENCOUNTER — Other Ambulatory Visit: Payer: Self-pay | Admitting: Internal Medicine

## 2022-08-23 DIAGNOSIS — Z1231 Encounter for screening mammogram for malignant neoplasm of breast: Secondary | ICD-10-CM

## 2022-09-02 ENCOUNTER — Ambulatory Visit: Payer: Medicare HMO | Admitting: Hematology and Oncology

## 2022-09-02 DIAGNOSIS — Z0000 Encounter for general adult medical examination without abnormal findings: Secondary | ICD-10-CM | POA: Diagnosis not present

## 2022-09-02 DIAGNOSIS — R5383 Other fatigue: Secondary | ICD-10-CM | POA: Diagnosis not present

## 2022-09-02 DIAGNOSIS — E782 Mixed hyperlipidemia: Secondary | ICD-10-CM | POA: Diagnosis not present

## 2022-09-02 DIAGNOSIS — R7303 Prediabetes: Secondary | ICD-10-CM | POA: Diagnosis not present

## 2022-09-02 DIAGNOSIS — I10 Essential (primary) hypertension: Secondary | ICD-10-CM | POA: Diagnosis not present

## 2022-09-02 DIAGNOSIS — Z23 Encounter for immunization: Secondary | ICD-10-CM | POA: Diagnosis not present

## 2022-09-02 NOTE — Progress Notes (Signed)
Patient Care Team: Merrilee Seashore, MD as PCP - General (Internal Medicine)  DIAGNOSIS:  Encounter Diagnosis  Name Primary?   Malignant neoplasm of upper-outer quadrant of left breast in female, estrogen receptor positive ([place])     SUMMARY OF ONCOLOGIC HISTORY: Oncology History  Malignant neoplasm of upper-outer quadrant of left breast in female, estrogen receptor positive ([place])  04/26/2016 Initial Diagnosis   Patient palpated a left breast mass. 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 She underwent a left lumpectomy on 04/26/16 for which pathology showed invasive and in situ ductal carcinoma, 1.5cm, 3 axillary lymph nodes negative, positive margin resected on 09/14/16.    05/2016 - 05/2017 Chemotherapy   She underwent adjuvant chemotherapy with weekly Taxol followed by Herceptin for a year.    12/2016 -  Anti-estrogen oral therapy   She began antiestrogen therapy with letrozole in January 2018, and eventually switched to anastrozole due to joint pain. She was under care at the [facility] in [place] and at [facility] in [place], [place].     CHIEF COMPLIANT: Follow-up of left breast cancer on letrozole    INTERVAL HISTORY: Jennifer Chan is a 71 y.o. with above-mentioned history of left breast cancer. She presents to the clinic today for follow-up. She states that she has some joint pain but she can tolerate it. Denies any other side effects or symptoms. She does have some tenderness around the breast. She does not get that much time for exercise. 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    ALLERGIES:  has No Known Allergies.  MEDICATIONS:  Current Outpatient Medications  Medication Sig Dispense Refill   amLODipine (NORVASC) 10 MG tablet Take 1 tablet (10 mg total) by mouth daily.     letrozole (FEMARA) 2.5 MG tablet TAKE 1 TABLET BY MOUTH EVERY DAY 90 tablet 1    metoprolol succinate (TOPROL-XL) 100 MG 24 hr tablet Take 1 tablet (100 mg total) by mouth daily. Take with or immediately following a meal.     pravastatin (PRAVACHOL) 40 MG tablet Take 1 tablet (40 mg total) by mouth daily. 30 tablet    Tragacanth (ASTRAGALUS ROOT) POWD 500 mg by Does not apply route daily.  0   Vitamin D3 (VITAMIN D) 25 MCG tablet Take 1 tablet (1,000 Units total) by mouth daily. 60 tablet    No current facility-administered medications for this visit.    PHYSICAL EXAMINATION: ECOG PERFORMANCE STATUS: 1 - Symptomatic but completely ambulatory  Vitals:   09/03/22 0957  BP: (!) 166/74  Pulse: 74  Resp: 18  Temp: (!) 97.2 F (36.2 C)  SpO2: 98%   Filed Weights   09/03/22 0957  Weight: 158 lb 3.2 oz (71.8 kg)    BREAST: No palpable masses or nodules in either right or left breasts. No palpable axillary supraclavicular or infraclavicular adenopathy no breast tenderness or nipple discharge. (exam performed in the presence of a chaperone)  ASSESSMENT & PLAN:  Malignant neoplasm of upper-outer quadrant of left breast in female, estrogen receptor positive ([place]) 03/19/2016: Palpable left breast mass revealed grade 2 IDC ER positive PR negative HER-2 3+ positive 04/26/2016: Left lumpectomy: IDC with DCIS 1.5 cm, 0/3 lymph nodes negative, positive margin resected on 09/14/2016 June 2017: Adjuvant chemo with Taxol Herceptin followed by Herceptin maintenance for 1 year January 2018: Initially letrozole switched to anastrozole switched to exemestane, switched to letrozole 2022 (Treatment  received at [facility] in [place] and [facility] in [place])   Current treatment:  Switched from exemestane to letrozole because of high cost  Letrozole toxicities:  1.  Intermittent joint stiffness and achiness 2. occasional hot flashes Plan to treat her for 1 more year with antiestrogen therapy   Breast cancer surveillance: 1.  Breast  exam 09/03/2022: Benign 2. mammograms:  10/05/2021: Benign breast density category B.   Return to clinic in 1 year for follow-up and that we will complete 7 years of antiestrogen therapy.    No orders of the defined types were placed in this encounter.  The patient has a good understanding of the overall plan. she agrees with it. she will call with any problems that may develop before the next visit here. Total time spent: 30 mins including face to face time and time spent for planning, charting and co-ordination of care   Harriette Ohara, MD 09/03/22    I Gardiner Coins am scribing for Dr. Lindi Adie  I have reviewed the above documentation for accuracy and completeness, and I agree with the above.

## 2022-09-03 ENCOUNTER — Inpatient Hospital Stay: Payer: Medicare HMO | Attending: Hematology and Oncology | Admitting: Hematology and Oncology

## 2022-09-03 ENCOUNTER — Other Ambulatory Visit: Payer: Self-pay

## 2022-09-03 DIAGNOSIS — Z9221 Personal history of antineoplastic chemotherapy: Secondary | ICD-10-CM | POA: Insufficient documentation

## 2022-09-03 DIAGNOSIS — Z170 Estrogen receptor positive status [ER+]: Secondary | ICD-10-CM | POA: Diagnosis not present

## 2022-09-03 DIAGNOSIS — Z79811 Long term (current) use of aromatase inhibitors: Secondary | ICD-10-CM | POA: Diagnosis not present

## 2022-09-03 DIAGNOSIS — C50412 Malignant neoplasm of upper-outer quadrant of left female breast: Secondary | ICD-10-CM | POA: Insufficient documentation

## 2022-09-03 NOTE — Assessment & Plan Note (Addendum)
03/19/2016: Palpable left breast mass revealed grade 2 IDC ER positive PR negative HER-2 3+ positive 04/26/2016: Left lumpectomy: IDC with DCIS 1.5 cm, 0/3 lymph nodes negative, positive margin resected on 09/14/2016 June 2017: Adjuvant chemo with Taxol Herceptin followed by Herceptin maintenance for 1 year January 2018: Initially letrozole switched to anastrozole switched to exemestane, switched to letrozole 2022 ([facility] [place] and [facility] in [place])  Current treatment:  Switched from exemestane to letrozole because of high cost  Letrozole toxicities:  1.  Intermittent joint stiffness and achiness 2. occasional hot flashes Plan to treat her for 1 more year with antiestrogen therapy  Breast cancer surveillance: 1.Breast exam 09/03/2022: Benign 2.mammograms:  10/05/2021: Benign breast density category B.  Return to clinic in 1 year for follow-up and that we will complete 7 years of antiestrogen therapy. 

## 2022-09-09 DIAGNOSIS — R7303 Prediabetes: Secondary | ICD-10-CM | POA: Diagnosis not present

## 2022-09-09 DIAGNOSIS — C50412 Malignant neoplasm of upper-outer quadrant of left female breast: Secondary | ICD-10-CM | POA: Diagnosis not present

## 2022-09-09 DIAGNOSIS — E782 Mixed hyperlipidemia: Secondary | ICD-10-CM | POA: Diagnosis not present

## 2022-09-09 DIAGNOSIS — Z23 Encounter for immunization: Secondary | ICD-10-CM | POA: Diagnosis not present

## 2022-09-09 DIAGNOSIS — I10 Essential (primary) hypertension: Secondary | ICD-10-CM | POA: Diagnosis not present

## 2022-09-09 DIAGNOSIS — Z0000 Encounter for general adult medical examination without abnormal findings: Secondary | ICD-10-CM | POA: Diagnosis not present

## 2022-09-09 DIAGNOSIS — E041 Nontoxic single thyroid nodule: Secondary | ICD-10-CM | POA: Diagnosis not present

## 2022-09-30 ENCOUNTER — Ambulatory Visit: Payer: Medicare HMO

## 2022-11-02 ENCOUNTER — Other Ambulatory Visit: Payer: Self-pay | Admitting: Hematology and Oncology

## 2022-11-17 ENCOUNTER — Ambulatory Visit
Admission: RE | Admit: 2022-11-17 | Discharge: 2022-11-17 | Disposition: A | Discharge status: Home / Self Care | Payer: Medicare HMO | Source: Ambulatory Visit | Attending: Internal Medicine | Admitting: Internal Medicine

## 2022-11-17 DIAGNOSIS — Z1231 Encounter for screening mammogram for malignant neoplasm of breast: Secondary | ICD-10-CM | POA: Diagnosis not present

## 2023-03-17 DIAGNOSIS — I10 Essential (primary) hypertension: Secondary | ICD-10-CM | POA: Diagnosis not present

## 2023-03-17 DIAGNOSIS — E782 Mixed hyperlipidemia: Secondary | ICD-10-CM | POA: Diagnosis not present

## 2023-03-17 DIAGNOSIS — R7303 Prediabetes: Secondary | ICD-10-CM | POA: Diagnosis not present

## 2023-03-23 ENCOUNTER — Other Ambulatory Visit: Payer: Self-pay | Admitting: Internal Medicine

## 2023-03-23 DIAGNOSIS — E041 Nontoxic single thyroid nodule: Secondary | ICD-10-CM

## 2023-03-28 DIAGNOSIS — I10 Essential (primary) hypertension: Secondary | ICD-10-CM | POA: Diagnosis not present

## 2023-03-28 DIAGNOSIS — C50412 Malignant neoplasm of upper-outer quadrant of left female breast: Secondary | ICD-10-CM | POA: Diagnosis not present

## 2023-03-28 DIAGNOSIS — E782 Mixed hyperlipidemia: Secondary | ICD-10-CM | POA: Diagnosis not present

## 2023-03-28 DIAGNOSIS — Z117 Encounter for testing for latent tuberculosis infection: Secondary | ICD-10-CM | POA: Diagnosis not present

## 2023-03-28 DIAGNOSIS — R7303 Prediabetes: Secondary | ICD-10-CM | POA: Diagnosis not present

## 2023-03-28 DIAGNOSIS — N182 Chronic kidney disease, stage 2 (mild): Secondary | ICD-10-CM | POA: Diagnosis not present

## 2023-03-28 DIAGNOSIS — E041 Nontoxic single thyroid nodule: Secondary | ICD-10-CM | POA: Diagnosis not present

## 2023-03-31 ENCOUNTER — Ambulatory Visit
Admission: RE | Admit: 2023-03-31 | Discharge: 2023-03-31 | Disposition: A | Discharge status: Home / Self Care | Payer: Medicare HMO | Source: Ambulatory Visit | Attending: Internal Medicine | Admitting: Internal Medicine

## 2023-03-31 DIAGNOSIS — E042 Nontoxic multinodular goiter: Secondary | ICD-10-CM | POA: Diagnosis not present

## 2023-03-31 DIAGNOSIS — E041 Nontoxic single thyroid nodule: Secondary | ICD-10-CM

## 2023-04-21 DIAGNOSIS — Z825 Family history of asthma and other chronic lower respiratory diseases: Secondary | ICD-10-CM | POA: Diagnosis not present

## 2023-04-21 DIAGNOSIS — Z89022 Acquired absence of left finger(s): Secondary | ICD-10-CM | POA: Diagnosis not present

## 2023-04-21 DIAGNOSIS — E010 Iodine-deficiency related diffuse (endemic) goiter: Secondary | ICD-10-CM | POA: Diagnosis not present

## 2023-04-21 DIAGNOSIS — Z811 Family history of alcohol abuse and dependence: Secondary | ICD-10-CM | POA: Diagnosis not present

## 2023-04-21 DIAGNOSIS — Z79811 Long term (current) use of aromatase inhibitors: Secondary | ICD-10-CM | POA: Diagnosis not present

## 2023-04-21 DIAGNOSIS — Z823 Family history of stroke: Secondary | ICD-10-CM | POA: Diagnosis not present

## 2023-04-21 DIAGNOSIS — E785 Hyperlipidemia, unspecified: Secondary | ICD-10-CM | POA: Diagnosis not present

## 2023-04-21 DIAGNOSIS — M1990 Unspecified osteoarthritis, unspecified site: Secondary | ICD-10-CM | POA: Diagnosis not present

## 2023-04-21 DIAGNOSIS — I10 Essential (primary) hypertension: Secondary | ICD-10-CM | POA: Diagnosis not present

## 2023-04-21 DIAGNOSIS — Z008 Encounter for other general examination: Secondary | ICD-10-CM | POA: Diagnosis not present

## 2023-04-21 DIAGNOSIS — Z8249 Family history of ischemic heart disease and other diseases of the circulatory system: Secondary | ICD-10-CM | POA: Diagnosis not present

## 2023-04-21 DIAGNOSIS — Z87891 Personal history of nicotine dependence: Secondary | ICD-10-CM | POA: Diagnosis not present

## 2023-04-21 DIAGNOSIS — C50919 Malignant neoplasm of unspecified site of unspecified female breast: Secondary | ICD-10-CM | POA: Diagnosis not present

## 2023-05-23 IMAGING — MG MM DIGITAL SCREENING BILAT W/ TOMO AND CAD
6 of 10 series · 6 of 30 positions shown · non-contrast
Comparison: Previous exam(s).

CLINICAL DATA: Screening.

EXAM:
DIGITAL SCREENING BILATERAL MAMMOGRAM WITH TOMOSYNTHESIS AND CAD
TECHNIQUE: Bilateral screening digital craniocaudal and mediolateral oblique
mammograms were obtained. Bilateral screening digital breast
tomosynthesis was performed. The images were evaluated with
computer-aided detection.

[L MLO synth-2D (1 of 2)]
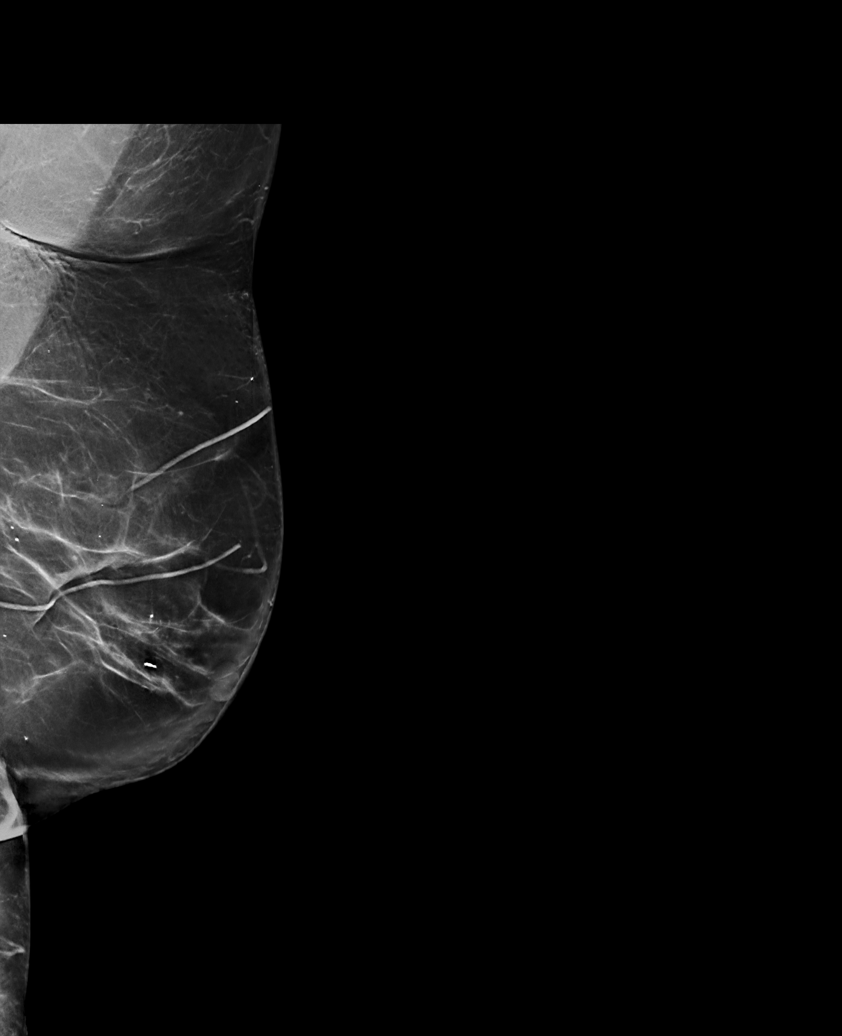

[L MLO synth-2D (2 of 2)]
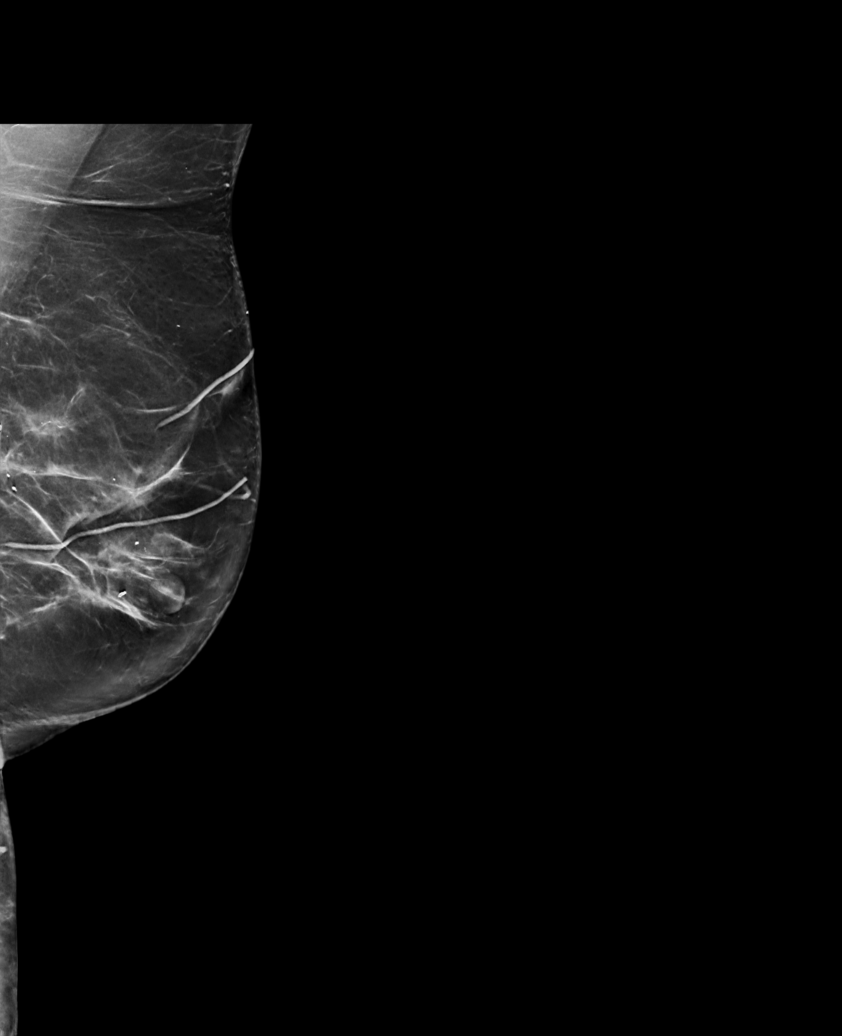

[R CC synth-2D]
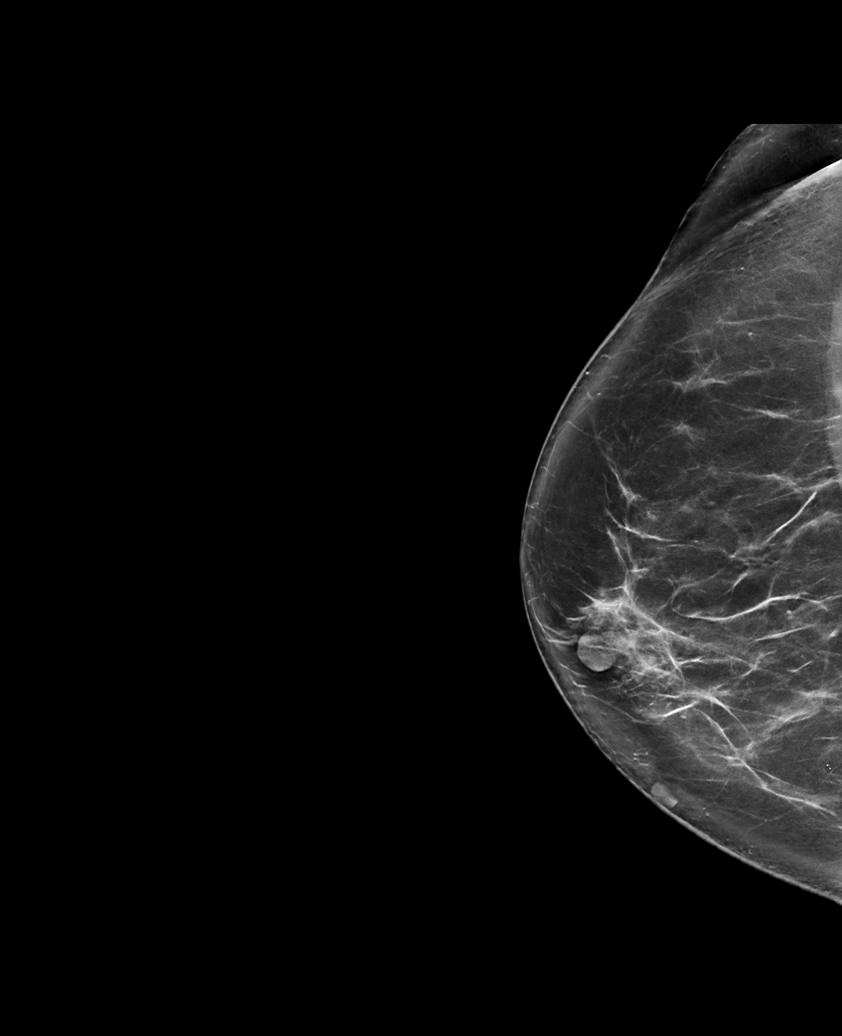

[R MLO synth-2D]
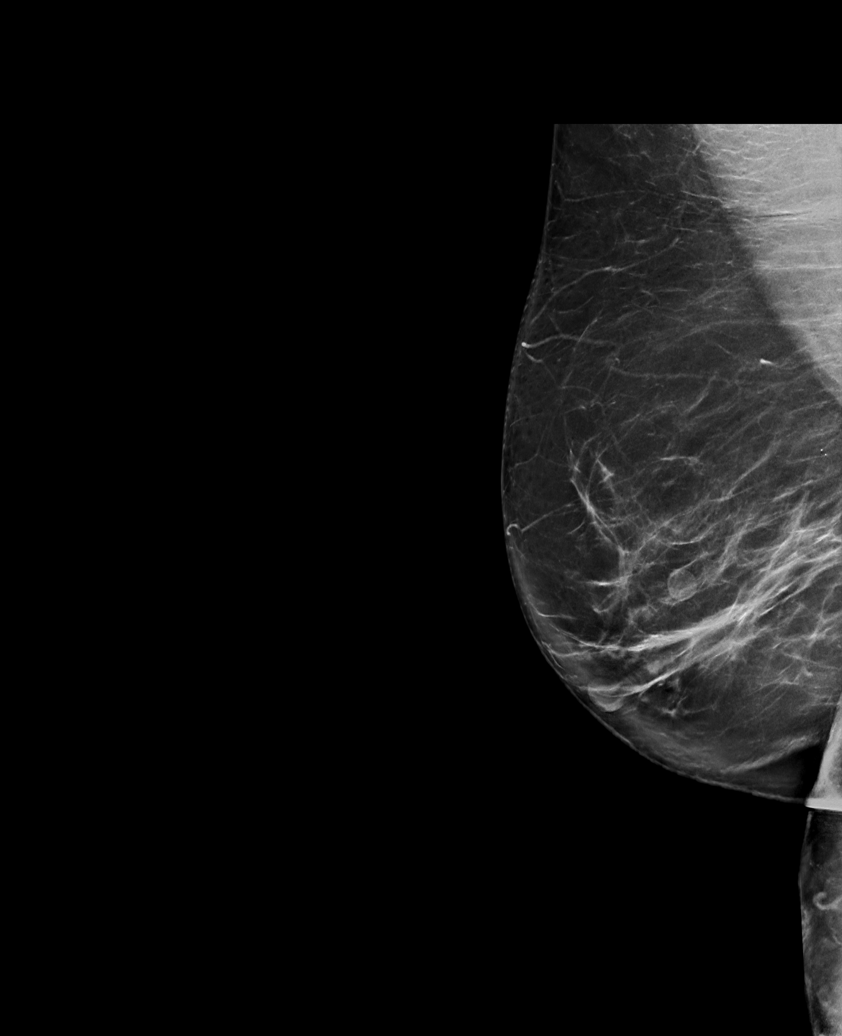

[L CC synth-2D]
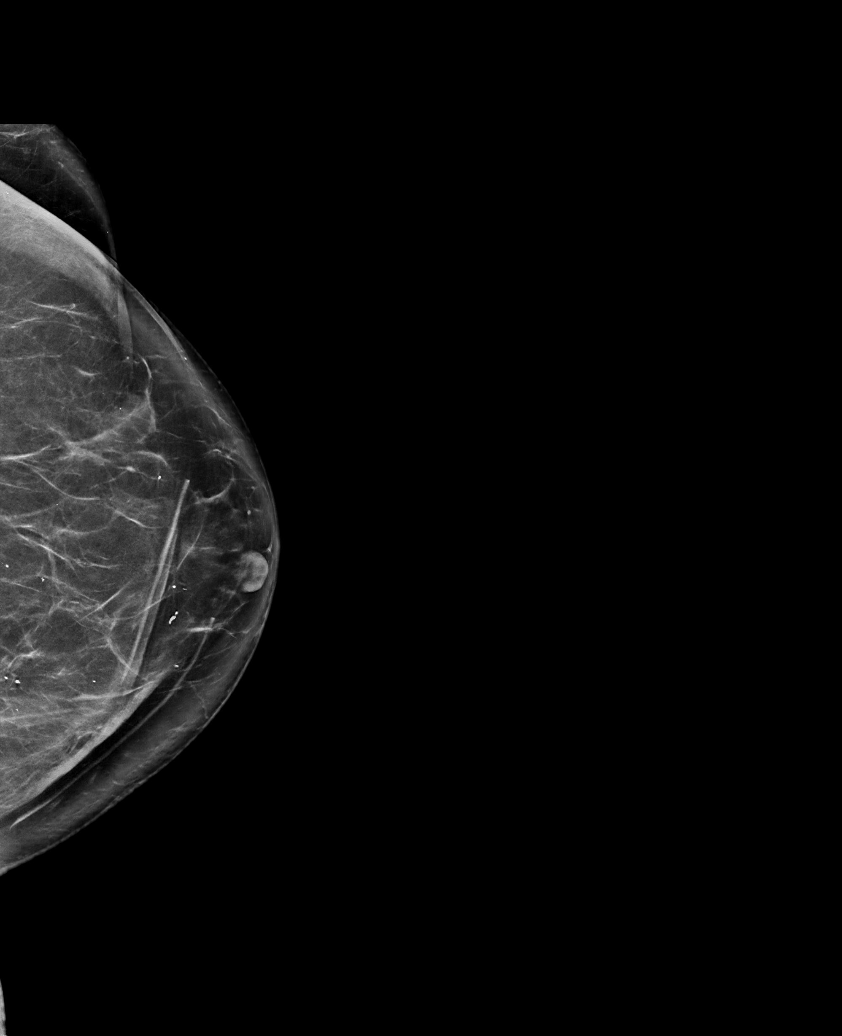

[L MLO tomo · tomo slice 45/89.0]
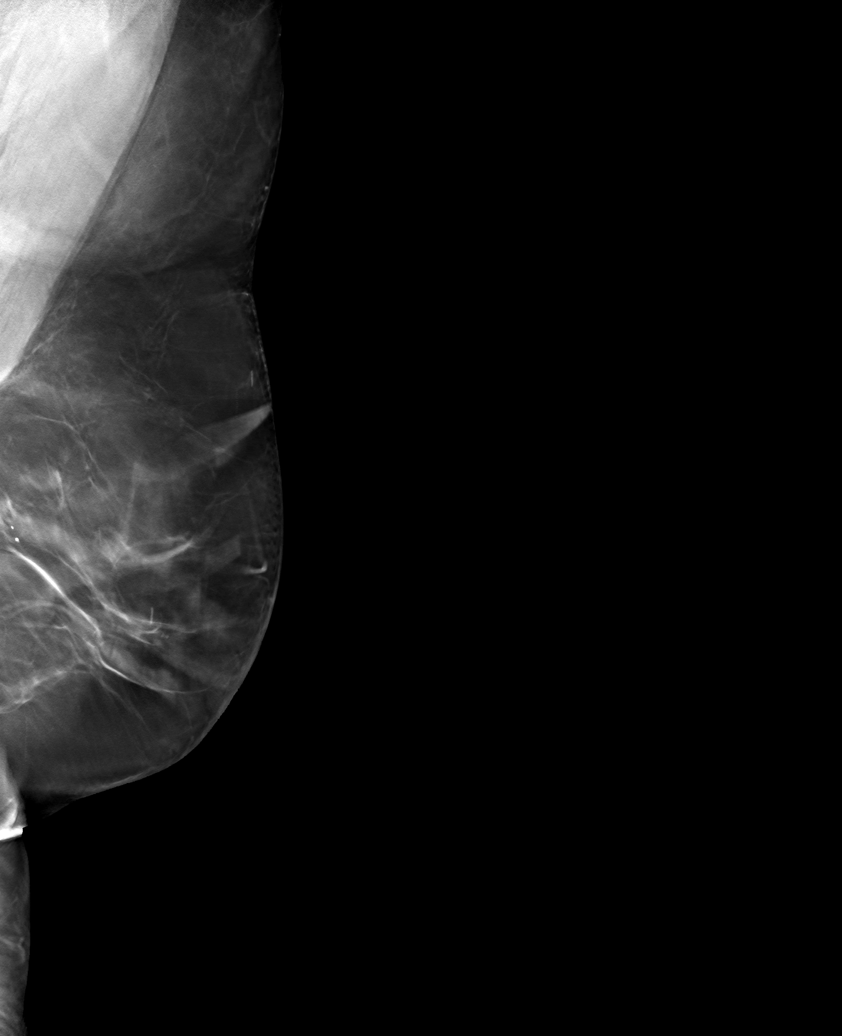

[6 of 30 positions shown; findings below may reference images not displayed]

ACR Breast Density Category b: There are scattered areas of
fibroglandular density.
FINDINGS: There are no findings suspicious for malignancy.
IMPRESSION: No mammographic evidence of malignancy. A result letter of this
screening mammogram will be mailed directly to the patient.

RECOMMENDATION:
Screening mammogram in one year. (Code:51-[id number])

BI-RADS CATEGORY  1: Negative.

## 2023-08-26 ENCOUNTER — Other Ambulatory Visit: Payer: Self-pay | Admitting: Internal Medicine

## 2023-08-26 DIAGNOSIS — Z1231 Encounter for screening mammogram for malignant neoplasm of breast: Secondary | ICD-10-CM

## 2023-09-05 ENCOUNTER — Inpatient Hospital Stay: Payer: Medicare HMO | Attending: Hematology and Oncology | Admitting: Hematology and Oncology

## 2023-09-05 VITALS — BP 141/52 | HR 68 | Temp 97.9°F | Resp 17 | Wt 153.1 lb

## 2023-09-05 DIAGNOSIS — Z170 Estrogen receptor positive status [ER+]: Secondary | ICD-10-CM | POA: Diagnosis not present

## 2023-09-05 DIAGNOSIS — Z9221 Personal history of antineoplastic chemotherapy: Secondary | ICD-10-CM | POA: Insufficient documentation

## 2023-09-05 DIAGNOSIS — C50412 Malignant neoplasm of upper-outer quadrant of left female breast: Secondary | ICD-10-CM | POA: Diagnosis not present

## 2023-09-05 DIAGNOSIS — Z79811 Long term (current) use of aromatase inhibitors: Secondary | ICD-10-CM | POA: Insufficient documentation

## 2023-09-05 NOTE — Assessment & Plan Note (Addendum)
03/19/2016: Palpable left breast mass revealed grade 2 IDC ER positive PR negative HER-2 3+ positive 04/26/2016: Left lumpectomy: IDC with DCIS 1.5 cm, 0/3 lymph nodes negative, positive margin resected on 09/14/2016 June 2017: Adjuvant chemo with Taxol Herceptin followed by Herceptin maintenance for 1 year January 2018: Initially letrozole switched to anastrozole switched to exemestane, switched to letrozole 2022 (Treatment received at [facility] [place] in [place] and [facility] in [place])   Current treatment:  Switched from exemestane to letrozole because of high cost   Letrozole toxicities:  1.  Intermittent joint stiffness and achiness 2. occasional hot flashes This concludes her antiestrogen therapy.  We can discontinue letrozole.  She completed 7 years of therapy and therefore we can discontinue the letrozole at this time.   Breast cancer surveillance: 1.  Breast exam 09/05/2023: Benign 2. mammograms: Scheduled for 11/21/2023  Return to clinic on an as-needed basis

## 2023-09-05 NOTE — Progress Notes (Signed)
Patient Care Team: Georgianne Fick, MD as PCP - General (Internal Medicine)  DIAGNOSIS:  Encounter Diagnosis  Name Primary?   Malignant neoplasm of upper-outer quadrant of left breast in female, estrogen receptor positive ([facility]) Yes    SUMMARY OF ONCOLOGIC HISTORY: Oncology History  Malignant neoplasm of upper-outer quadrant of left breast in female, estrogen receptor positive ([facility])  04/26/2016 Initial Diagnosis   Patient palpated a left breast mass. 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 She underwent a left lumpectomy on 04/26/16 for which pathology showed invasive and in situ ductal carcinoma, 1.5cm, 3 axillary lymph nodes negative, positive margin resected on 09/14/16.    05/2016 - 05/2017 Chemotherapy   She underwent adjuvant chemotherapy with weekly Taxol followed by Herceptin for a year.    12/2016 -  Anti-estrogen oral therapy   She began antiestrogen therapy with letrozole in January 2018, and eventually switched to anastrozole due to joint pain. She was under care at the [facility] in [place] and at [facility] in [place], [place].     CHIEF COMPLIANT:   Discussed the use of AI scribe software for clinical note transcription with the patient, who gave verbal consent to proceed.  History of Present Illness   The patient, diagnosed with breast cancer in 2017, has completed seven years of treatment with letrozole. She reports minimal side effects, including slight joint stiffness and occasional hot flashes. She underwent Taxol Herceptin chemotherapy and one year of Herceptin treatment at the onset of her diagnosis.  In addition to her cancer treatment, the patient reports overall good health. She is actively working and enjoying life.         ALLERGIES:  has No Known Allergies.  MEDICATIONS:  Current Outpatient Medications  Medication Sig Dispense Refill   amLODipine (NORVASC) 10 MG  tablet Take 1 tablet (10 mg total) by mouth daily.     metoprolol succinate (TOPROL-XL) 100 MG 24 hr tablet Take 1 tablet (100 mg total) by mouth daily. Take with or immediately following a meal.     pravastatin (PRAVACHOL) 40 MG tablet Take 1 tablet (40 mg total) by mouth daily. 30 tablet    PREVNAR 20 0.5 ML injection      SHINGRIX injection      Tragacanth (ASTRAGALUS ROOT) POWD 500 mg by Does not apply route daily.  0   Vitamin D3 (VITAMIN D) 25 MCG tablet Take 1 tablet (1,000 Units total) by mouth daily. 60 tablet    No current facility-administered medications for this visit.    PHYSICAL EXAMINATION: ECOG PERFORMANCE STATUS: 1 - Symptomatic but completely ambulatory  Vitals:   09/05/23 1114  BP: (!) 141/52  Pulse: 68  Resp: 17  Temp: 97.9 F (36.6 C)  SpO2: 100%   Filed Weights   09/05/23 1114  Weight: 153 lb 1.6 oz (69.4 kg)    Physical Exam          (exam performed in the presence of a chaperone)  LABORATORY DATA:  I have reviewed the data as listed     No data to display          No results found for: "WBC", "HGB", "HCT", "MCV", "PLT", "NEUTROABS"  ASSESSMENT & PLAN:  Malignant neoplasm of upper-outer quadrant of left breast in female, estrogen receptor positive ([facility]) 03/19/2016: Palpable left breast mass revealed grade 2 IDC ER positive PR negative HER-2 3+ positive 04/26/2016: Left lumpectomy: IDC with DCIS 1.5  cm, 0/3 lymph nodes negative, positive margin resected on 09/14/2016 June 2017: Adjuvant chemo with Taxol Herceptin followed by Herceptin maintenance for 1 year January 2018: Initially letrozole switched to anastrozole switched to exemestane, switched to letrozole 2022 (Treatment received at [facility] [place] in [place] and [facility] in [place])   Current treatment:  Switched from exemestane to letrozole because of high cost   Letrozole toxicities:  1.  Intermittent joint stiffness and achiness 2.  occasional hot flashes This concludes her antiestrogen therapy.  We can discontinue letrozole.  She completed 7 years of therapy and therefore we can discontinue the letrozole at this time.   Breast cancer surveillance: 1.  Breast exam 09/05/2023: Benign 2. mammograms: Scheduled for 11/21/2023   ------------------------------------- Assessment and Plan    Breast Cancer Completed 7 years of Letrozole with minimal side effects. Discussed the benefits and risks of continuing Letrozole beyond 7 years. Patient prefers to stop medication. -Discontinue Letrozole after current supply is finished.  Breast Cancer Surveillance Patient is scheduled for annual mammogram in December. -Continue annual mammograms.  General Health Maintenance Patient reports feeling well and is scheduled to see primary care physician next month. -Continue routine follow-up with primary care physician. -Transition to as-needed basis for oncology follow-up, with the understanding that the oncology team is available for any concerns.          No orders of the defined types were placed in this encounter.  The patient has a good understanding of the overall plan. she agrees with it. she will call with any problems that may develop before the next visit here. Total time spent: 30 mins including face to face time and time spent for planning, charting and co-ordination of care   Tamsen Meek, MD 09/05/23

## 2023-09-26 DIAGNOSIS — I10 Essential (primary) hypertension: Secondary | ICD-10-CM | POA: Diagnosis not present

## 2023-09-26 DIAGNOSIS — E041 Nontoxic single thyroid nodule: Secondary | ICD-10-CM | POA: Diagnosis not present

## 2023-09-26 DIAGNOSIS — E782 Mixed hyperlipidemia: Secondary | ICD-10-CM | POA: Diagnosis not present

## 2023-09-26 DIAGNOSIS — Z0000 Encounter for general adult medical examination without abnormal findings: Secondary | ICD-10-CM | POA: Diagnosis not present

## 2023-09-26 DIAGNOSIS — C50412 Malignant neoplasm of upper-outer quadrant of left female breast: Secondary | ICD-10-CM | POA: Diagnosis not present

## 2023-09-26 DIAGNOSIS — R7303 Prediabetes: Secondary | ICD-10-CM | POA: Diagnosis not present

## 2023-09-26 DIAGNOSIS — Z23 Encounter for immunization: Secondary | ICD-10-CM | POA: Diagnosis not present

## 2023-10-03 DIAGNOSIS — E041 Nontoxic single thyroid nodule: Secondary | ICD-10-CM | POA: Diagnosis not present

## 2023-10-03 DIAGNOSIS — N182 Chronic kidney disease, stage 2 (mild): Secondary | ICD-10-CM | POA: Diagnosis not present

## 2023-10-03 DIAGNOSIS — Z0000 Encounter for general adult medical examination without abnormal findings: Secondary | ICD-10-CM | POA: Diagnosis not present

## 2023-10-03 DIAGNOSIS — I10 Essential (primary) hypertension: Secondary | ICD-10-CM | POA: Diagnosis not present

## 2023-10-03 DIAGNOSIS — R7303 Prediabetes: Secondary | ICD-10-CM | POA: Diagnosis not present

## 2023-10-03 DIAGNOSIS — C50412 Malignant neoplasm of upper-outer quadrant of left female breast: Secondary | ICD-10-CM | POA: Diagnosis not present

## 2023-10-03 DIAGNOSIS — E782 Mixed hyperlipidemia: Secondary | ICD-10-CM | POA: Diagnosis not present

## 2023-10-18 ENCOUNTER — Other Ambulatory Visit: Payer: Self-pay | Admitting: Hematology and Oncology

## 2023-10-20 IMAGING — US US THYROID
1 series · 12 of 25 positions shown · non-contrast
Comparison: None.

CLINICAL DATA: Thyromegaly

EXAM:
THYROID ULTRASOUND
TECHNIQUE: Ultrasound examination of the thyroid gland and adjacent soft
tissues was performed.

[Series 1: us thyroid · 0.07mm/px · 12 of 73 slices shown]
[im 4/73]
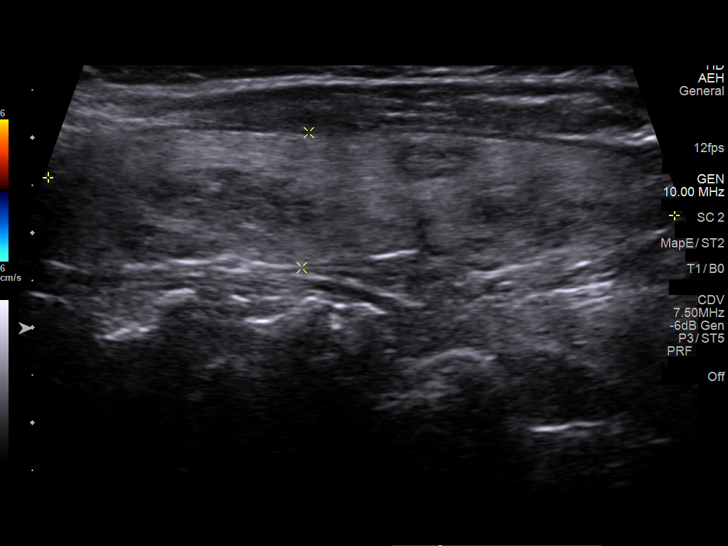
[im 10/73]
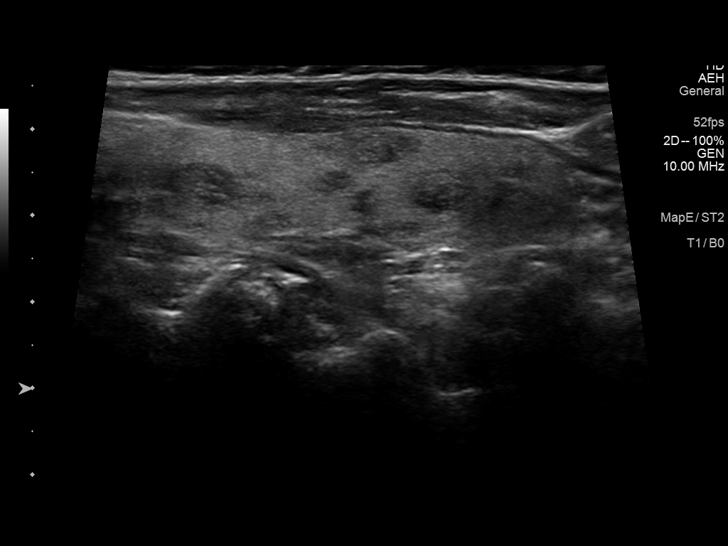
[im 16/73]
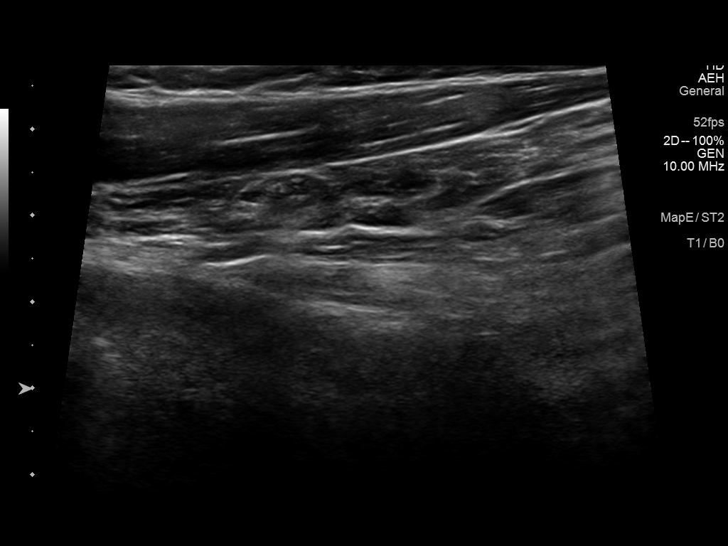
[im 22/73]
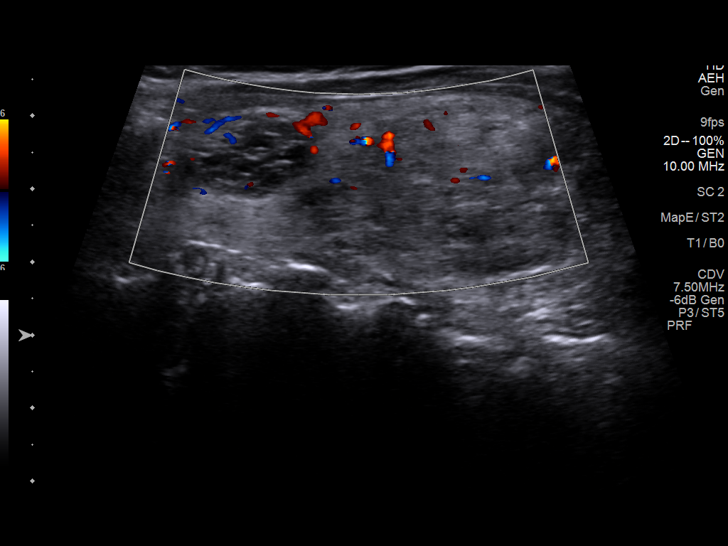
[im 28/73]
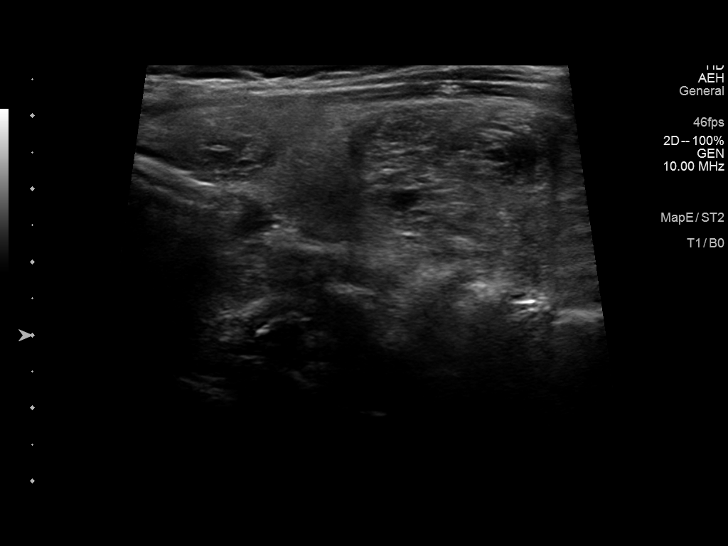
[im 34/73]
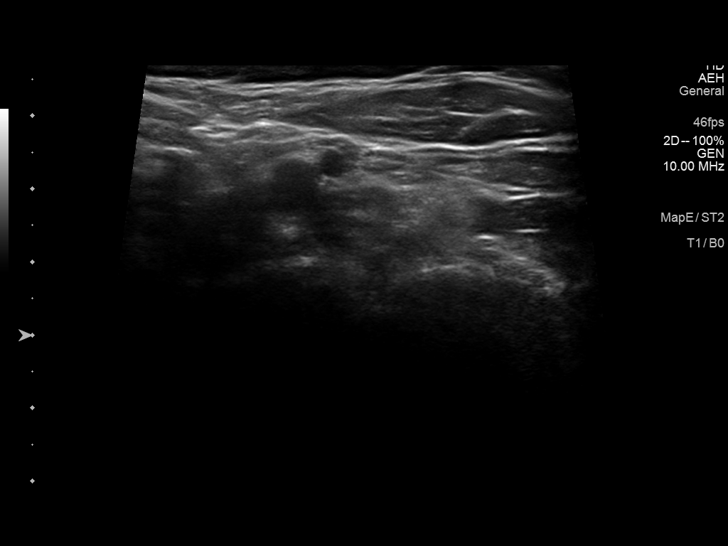
[im 40/73]
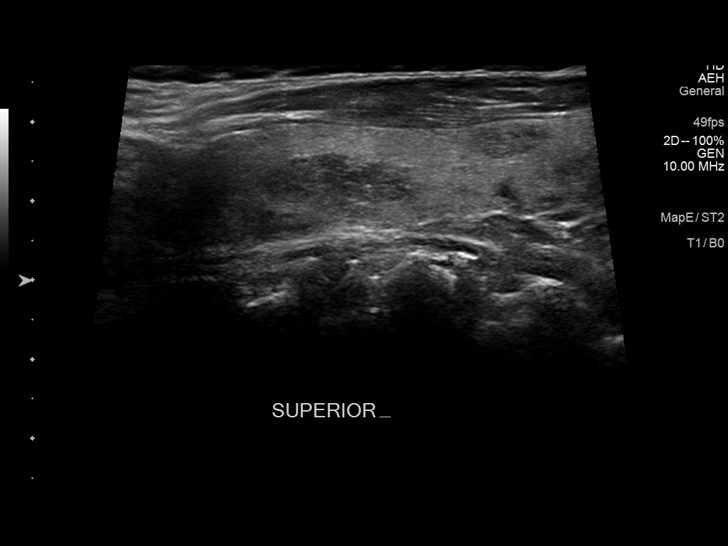
[im 46/73]
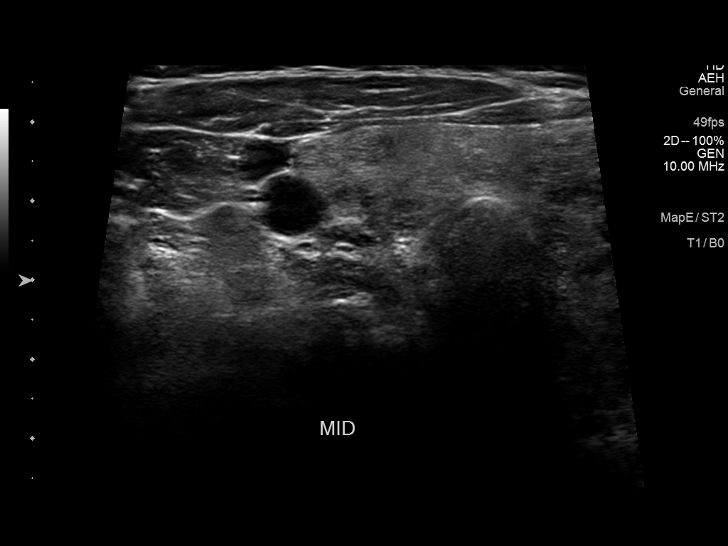
[im 52/73]
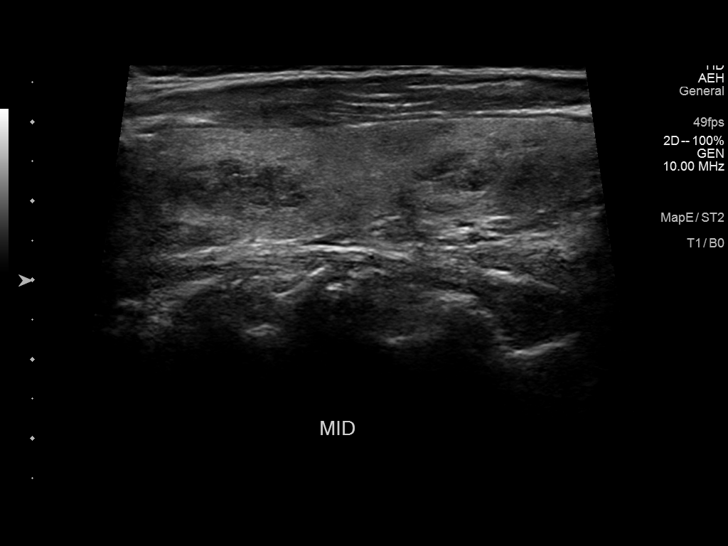
[im 58/73]
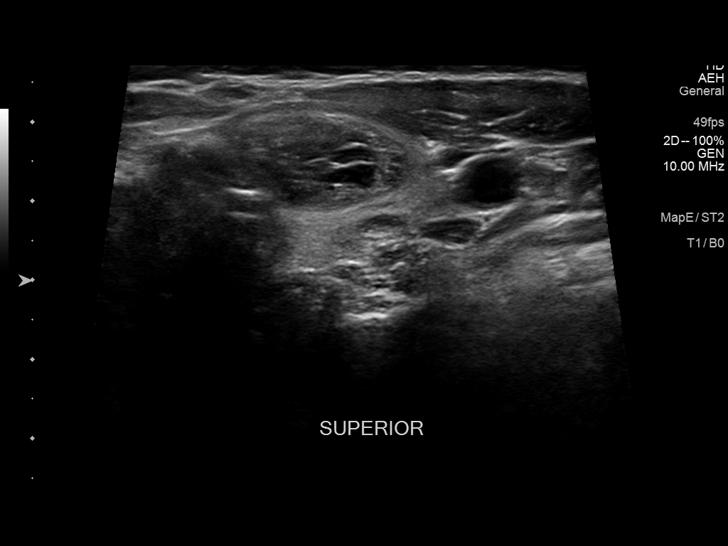
[im 64/73]
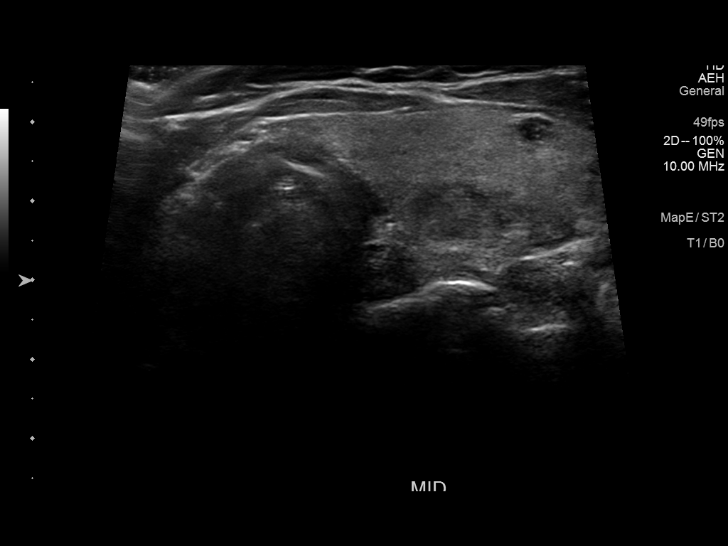
[im 70/73]
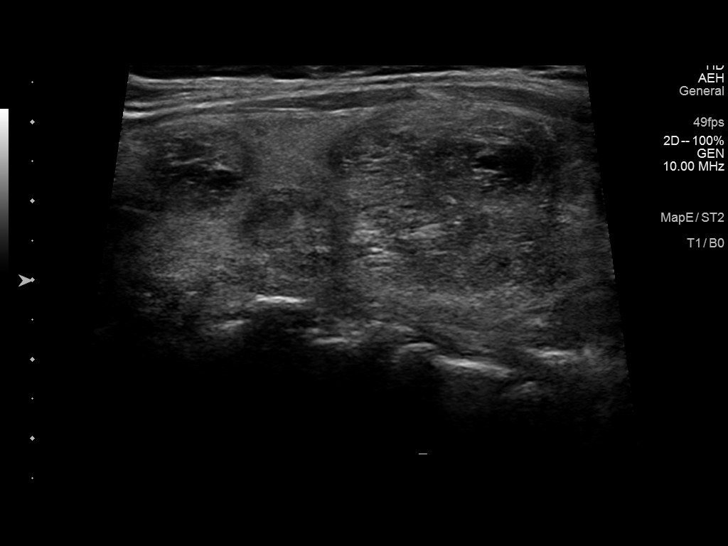

[12 of 25 positions shown; findings below may reference images not displayed]

FINDINGS: Parenchymal Echotexture: Moderately heterogenous

Isthmus: 0.7 cm thickness

Right lobe: 6.6 x 1.4 x 2.1 cm

Left lobe: 7.3 x 2.6 x 3.3 cm

_________________________________________________________

Estimated total number of nodules >/= 1 cm: 6-10

Number of spongiform nodules >/=  2 cm not described below (TR1): 0

Number of mixed cystic and solid nodules >/= 1.5 cm not described
below (TR2): 0

_________________________________________________________

Nodule 1: 0.7 cm hypoechoic left isthmic nodule without
calcifications; This nodule does NOT meet TI-RADS criteria for
biopsy or dedicated follow-up.

Nodule # 2:

Location: ; right superior

Maximum size: 1.9 cm; Other 2 dimensions: 1.2 x 0.5 cm

Composition: solid/almost completely solid (2)

Echogenicity: hypoechoic (2)

Shape: not taller-than-wide (0)

Margins: ill-defined (0)

Echogenic foci: none (0)

ACR TI-RADS total points: 4.

ACR TI-RADS risk category: TR 4.

ACR TI-RADS recommendations:

**Given size (>/= 1.5 cm) and appearance, fine needle aspiration of
this moderately suspicious nodule should be considered based on
TI-RADS criteria.

_________________________________________________________

Nodule 3: 0.9 cm hypoechoic nodule without calcifications, mid
right; This nodule does NOT meet TI-RADS criteria for biopsy or
dedicated follow-up.

Nodule 4: 1 cm hypoechoic nodule without calcifications, posterior
mid right, Recommend annual/biennial ultrasound follow-up

Nodule 5: 0.95 cm hypoechoic nodule without calcifications, mid
inferior right; This nodule does NOT meet TI-RADS criteria for
biopsy or dedicated follow-up.

Nodule 6: 2.1 cm spongiform nodule, superior left; This nodule does
NOT meet TI-RADS criteria for biopsy or dedicated follow-up.

Nodule # 7:

Location: Left; mid

Maximum size: 1.3 cm; Other 2 dimensions: 1.3 x 0.7 cm

Composition: solid/almost completely solid (2)

Echogenicity: hypoechoic (2)

Shape: not taller-than-wide (0)

Margins: ill-defined (0)

Echogenic foci: none (0)

ACR TI-RADS total points: 4.

ACR TI-RADS risk category: TR 4.

ACR TI-RADS recommendations:

*Given size (>/= 1 - 1.4 cm) and appearance, a follow-up ultrasound
in 1 year should be considered based on TI-RADS criteria.

_________________________________________________________

Nodule 8: 1.4 cm isoechoic nodule without calcifications, inferior
medial left; This nodule does NOT meet TI-RADS criteria for biopsy
or dedicated follow-up.

Nodule # 9:

Location: Left; inferior

Maximum size: 3.5 cm; Other 2 dimensions: 2.9 x 2.3 cm

Composition: mixed cystic and solid (1)

Echogenicity: hypoechoic (2)

Shape: not taller-than-wide (0)

Margins: ill-defined (0)

Echogenic foci: none (0)

ACR TI-RADS total points: 3.

ACR TI-RADS risk category: TR 3.

ACR TI-RADS recommendations:

**Given size (>/= 2.5 cm) and appearance, fine needle aspiration of
this mildly suspicious nodule should be considered based on TI-RADS
criteria.

_________________________________________________________

No regional cervical adenopathy identified.
IMPRESSION: 1. Thyromegaly with bilateral nodules.
2. Recommend FNA biopsy of 1.9 cm moderately suspicious superior
right nodule AND 3.5 cm mildly suspicious inferior left nodule.
3. Recommend annual/biennial ultrasound follow-up of additional
nodules as above, until stability x5 years confirmed.

The above is in keeping with the ACR TI-RADS recommendations - [HOSPITAL] 9742;[DATE].

## 2023-11-21 ENCOUNTER — Ambulatory Visit
Admission: RE | Admit: 2023-11-21 | Discharge: 2023-11-21 | Disposition: A | Discharge status: Home / Self Care | Payer: Medicare Other | Source: Ambulatory Visit | Attending: Internal Medicine | Admitting: Internal Medicine

## 2023-11-21 DIAGNOSIS — Z1231 Encounter for screening mammogram for malignant neoplasm of breast: Secondary | ICD-10-CM

## 2024-01-04 DIAGNOSIS — G43009 Migraine without aura, not intractable, without status migrainosus: Secondary | ICD-10-CM | POA: Diagnosis not present

## 2024-01-24 DIAGNOSIS — G43009 Migraine without aura, not intractable, without status migrainosus: Secondary | ICD-10-CM | POA: Diagnosis not present

## 2024-02-20 DIAGNOSIS — I10 Essential (primary) hypertension: Secondary | ICD-10-CM | POA: Diagnosis not present

## 2024-02-20 DIAGNOSIS — C50412 Malignant neoplasm of upper-outer quadrant of left female breast: Secondary | ICD-10-CM | POA: Diagnosis not present

## 2024-02-20 DIAGNOSIS — R7303 Prediabetes: Secondary | ICD-10-CM | POA: Diagnosis not present

## 2024-02-20 DIAGNOSIS — E782 Mixed hyperlipidemia: Secondary | ICD-10-CM | POA: Diagnosis not present

## 2024-02-20 DIAGNOSIS — N182 Chronic kidney disease, stage 2 (mild): Secondary | ICD-10-CM | POA: Diagnosis not present

## 2024-02-27 DIAGNOSIS — E782 Mixed hyperlipidemia: Secondary | ICD-10-CM | POA: Diagnosis not present

## 2024-02-27 DIAGNOSIS — G43009 Migraine without aura, not intractable, without status migrainosus: Secondary | ICD-10-CM | POA: Diagnosis not present

## 2024-02-27 DIAGNOSIS — E041 Nontoxic single thyroid nodule: Secondary | ICD-10-CM | POA: Diagnosis not present

## 2024-02-27 DIAGNOSIS — C50412 Malignant neoplasm of upper-outer quadrant of left female breast: Secondary | ICD-10-CM | POA: Diagnosis not present

## 2024-02-27 DIAGNOSIS — I10 Essential (primary) hypertension: Secondary | ICD-10-CM | POA: Diagnosis not present

## 2024-02-27 DIAGNOSIS — R7303 Prediabetes: Secondary | ICD-10-CM | POA: Diagnosis not present

## 2024-02-27 DIAGNOSIS — N182 Chronic kidney disease, stage 2 (mild): Secondary | ICD-10-CM | POA: Diagnosis not present

## 2024-08-02 DIAGNOSIS — E041 Nontoxic single thyroid nodule: Secondary | ICD-10-CM | POA: Diagnosis not present

## 2024-08-02 DIAGNOSIS — R7303 Prediabetes: Secondary | ICD-10-CM | POA: Diagnosis not present

## 2024-08-02 DIAGNOSIS — I10 Essential (primary) hypertension: Secondary | ICD-10-CM | POA: Diagnosis not present

## 2024-08-02 DIAGNOSIS — E782 Mixed hyperlipidemia: Secondary | ICD-10-CM | POA: Diagnosis not present

## 2024-08-02 DIAGNOSIS — G43009 Migraine without aura, not intractable, without status migrainosus: Secondary | ICD-10-CM | POA: Diagnosis not present

## 2024-08-02 DIAGNOSIS — N182 Chronic kidney disease, stage 2 (mild): Secondary | ICD-10-CM | POA: Diagnosis not present

## 2024-10-22 ENCOUNTER — Other Ambulatory Visit: Payer: Self-pay | Admitting: Family Medicine

## 2024-10-22 ENCOUNTER — Encounter: Payer: Self-pay | Admitting: Internal Medicine

## 2024-10-22 DIAGNOSIS — Z1231 Encounter for screening mammogram for malignant neoplasm of breast: Secondary | ICD-10-CM

## 2024-11-16 ENCOUNTER — Ambulatory Visit

## 2024-11-16 VITALS — BP 140/70 | HR 63 | Ht 63.0 in | Wt 155.8 lb

## 2024-11-16 DIAGNOSIS — Z0000 Encounter for general adult medical examination without abnormal findings: Secondary | ICD-10-CM | POA: Diagnosis not present

## 2024-11-16 NOTE — Patient Instructions (Signed)
 Jennifer Chan,  Thank you for taking the time for your Medicare Wellness Visit. I appreciate your continued commitment to your health goals. Please review the care plan we discussed, and feel free to reach out if I can assist you further.  Please note that Annual Wellness Visits do not include a physical exam. Some assessments may be limited, especially if the visit was conducted virtually. If needed, we may recommend an in-person follow-up with your provider.  Ongoing Care Seeing your primary care provider every 3 to 6 months helps us  monitor your health and provide consistent, personalized care. Next office visit on 12/11/2024.  Remember to discuss a Cologuard and a bone density screening with your new provider during your up coming office visit.  Keep up the good work and [employer].  Referrals If a referral was made during today's visit and you haven't received any updates within two weeks, please contact the referred provider directly to check on the status.  Recommended Screenings:  Health Maintenance  Topic Date Due   Hepatitis C Screening  Never done   Colon Cancer Screening  Never done   Osteoporosis screening with Bone Density Scan  Never done   Flu Shot  07/13/2024   COVID-19 Vaccine (5 - 2025-26 season) 08/13/2024   Medicare Annual Wellness Visit  09/25/2024   Breast Cancer Screening  11/20/2024   DTaP/Tdap/Td vaccine (2 - Td or Tdap) 07/03/2031   Pneumococcal Vaccine for age over 52  Completed   Zoster (Shingles) Vaccine  Completed   Meningitis B Vaccine  Aged Out       11/16/2024    2:30 PM  Advanced Directives  Does Patient Have a Medical Advance Directive? Yes  Type of Estate Agent of [place];Living will    Vision: Annual vision screenings are recommended for early detection of glaucoma, cataracts, and diabetic retinopathy. These exams can also reveal signs of chronic conditions such as diabetes and high blood pressure.  Dental: Annual  dental screenings help detect early signs of oral cancer, gum disease, and other conditions linked to overall health, including heart disease and diabetes.  Please see the attached documents for additional preventive care recommendations.

## 2024-11-16 NOTE — Progress Notes (Signed)
 Chief Complaint  Patient presents with   Medicare Wellness     Subjective:   Jennifer Chan is a 73 y.o. female who presents for a Medicare Annual Wellness Visit.  Visit info / Clinical Intake: Persons participating in visit and providing information:: patient Medicare Wellness Visit Mode:: In-person (required for WTM) Interpreter Needed?: No Pre-visit prep was completed: no AWV questionnaire completed by patient prior to visit?: no Living arrangements:: lives with spouse/significant other Patient's Overall Health Status Rating: (!) fair Typical amount of pain: some (back soreness) Does pain affect daily life?: no Are you currently prescribed opioids?: no  Dietary Habits and Nutritional Risks How many meals a day?: 2 Eats fruit and vegetables daily?: yes Most meals are obtained by: preparing own meals In the last 2 weeks, have you had any of the following?: none Diabetic:: no  Functional Status Activities of Daily Living (to include ambulation/medication): Independent Ambulation: Independent with device- listed below Home Assistive Devices/Equipment: Eyeglasses Medication Administration: Independent Home Management (perform basic housework or laundry): Independent Manage your own finances?: yes Primary transportation is: driving Concerns about vision?: no *vision screening is required for WTM* Concerns about hearing?: (!) yes (ringing in the ears) Uses hearing aids?: no Hear whispered voice?: yes  Fall Screening Falls in the past year?: 1 (fell down stairs) Number of falls in past year: 0 Was there an injury with Fall?: 0 Fall Risk Category Calculator: 1 Patient Fall Risk Level: Low Fall Risk  Fall Risk Fall risk Follow up: Falls evaluation completed; Falls prevention discussed  Home and Transportation Safety: All rugs have non-skid backing?: N/A, no rugs All stairs or steps have railings?: N/A, no stairs Grab bars in the bathtub or shower?: yes Have  non-skid surface in bathtub or shower?: yes Good home lighting?: yes Regular seat belt use?: yes Hospital stays in the last year:: no  Cognitive Assessment Difficulty concentrating, remembering, or making decisions? : no Will 6CIT or Mini Cog be Completed: no 6CIT or Mini Cog Declined: patient alert, oriented, able to answer questions appropriately and recall recent events  Advance Directives (For Healthcare) Does Patient Have a Medical Advance Directive?: Yes Type of Advance Directive: Healthcare Power of [place]; Living will  Reviewed/Updated  Reviewed/Updated: Reviewed All (Medical, Surgical, Family, Medications, Allergies, Care Teams, Patient Goals)    Allergies (verified) Patient has no known allergies.   Current Medications (verified) Outpatient Encounter Medications as of 11/16/2024  Medication Sig   amLODipine  (NORVASC ) 10 MG tablet Take 1 tablet (10 mg total) by mouth daily.   metoprolol  succinate (TOPROL -XL) 100 MG 24 hr tablet Take 1 tablet (100 mg total) by mouth daily. Take with or immediately following a meal.   olmesartan-hydrochlorothiazide (BENICAR HCT) 20-12.5 MG tablet Take 1 tablet by mouth daily.   Tragacanth (ASTRAGALUS ROOT) POWD 500 mg by Does not apply route daily.   Vitamin D3 (VITAMIN D) 25 MCG tablet Take 1 tablet (1,000 Units total) by mouth daily.   pravastatin  (PRAVACHOL ) 40 MG tablet Take 1 tablet (40 mg total) by mouth daily. (Patient not taking: Reported on 11/16/2024)   PREVNAR 20 0.5 ML injection    SHINGRIX injection    No facility-administered encounter medications on file as of 11/16/2024.    History: Past Medical History:  Diagnosis Date   Arthritis 2021   Breast cancer (HCC) 2017   Left Breast Cancer   Cancer (HCC) 2017   GERD (gastroesophageal reflux disease) 2017   Hyperlipidemia 2012   Hypertension 2012   Personal  history of chemotherapy 2017   Left Breast Cancer   Personal history of radiation therapy 2017   Left Breast  Cancer   Past Surgical History:  Procedure Laterality Date   ABDOMINAL HYSTERECTOMY  1993   comlete   BREAST EXCISIONAL BIOPSY Left    BREAST EXCISIONAL BIOPSY Left    BREAST LUMPECTOMY Left 2017   Family History  Problem Relation Age of Onset   Alcohol abuse Mother    Non-Hodgkin's lymphoma Mother    Heart disease Mother    Hyperlipidemia Mother    Hypertension Mother    Hyperlipidemia Father    Hypertension Father    Alcohol abuse Sister    Hyperlipidemia Sister    Hypertension Sister    Alcohol abuse Brother    Hyperlipidemia Brother    Hypertension Brother    Stroke Brother    Alcohol abuse Maternal Grandmother    Alcohol abuse Maternal Grandfather    Alcohol abuse Paternal Grandmother    Alcohol abuse Paternal Grandfather    Social History   Occupational History   Occupation: RETIRED  Tobacco Use   Smoking status: Former    Current packs/day: 0.00    Types: Cigarettes    Quit date: 12/14/2010    Years since quitting: 13.9   Smokeless tobacco: Never  Vaping Use   Vaping status: Never Used  Substance and Sexual Activity   Alcohol use: Never   Drug use: Never   Sexual activity: Yes   Tobacco Counseling Counseling given: Not Answered  SDOH Screenings   Food Insecurity: No Food Insecurity (11/16/2024)  Housing: Unknown (11/16/2024)  Transportation Needs: No Transportation Needs (11/16/2024)  Utilities: Not At Risk (11/16/2024)  Depression (PHQ2-9): Low Risk  (11/16/2024)  Physical Activity: Unknown (11/16/2024)  Social Connections: Socially Integrated (11/16/2024)  Stress: Stress Concern Present (11/16/2024)  Tobacco Use: Medium Risk (11/16/2024)  Health Literacy: Adequate Health Literacy (11/16/2024)   See flowsheets for full screening details  Depression Screen PHQ 2 & 9 Depression Scale- Over the past 2 weeks, how often have you been bothered by any of the following problems? Little interest or pleasure in doing things: 0 Feeling down, depressed, or  hopeless (PHQ Adolescent also includes...irritable): 0 PHQ-2 Total Score: 0 Trouble falling or staying asleep, or sleeping too much: 2 (falling asleep) Feeling tired or having little energy: 1 (both/due to being a care taker of husband) Poor appetite or overeating (PHQ Adolescent also includes...weight loss): 0 Feeling bad about yourself - or that you are a failure or have let yourself or your family down: 0 Trouble concentrating on things, such as reading the newspaper or watching television (PHQ Adolescent also includes...like school work): 0 Moving or speaking so slowly that other people could have noticed. Or the opposite - being so fidgety or restless that you have been moving around a lot more than usual: 0 Thoughts that you would be better off dead, or of hurting yourself in some way: 0 PHQ-9 Total Score: 3 If you checked off any problems, how difficult have these problems made it for you to do your work, take care of things at home, or get along with other people?: Not difficult at all  Depression Treatment Depression Interventions/Treatment : EYV7-0 Score <4 Follow-up Not Indicated     Goals Addressed             This Visit's Progress    Patient Stated       Straighten out back problem/2025  Objective:    Today's Vitals   11/16/24 1451  BP: (!) 140/70  Pulse: 63  SpO2: 98%  Weight: 155 lb 12.8 oz (70.7 kg)  Height: 5' 3 (1.6 m)   Body mass index is 27.6 kg/m.  Hearing/Vision screen Hearing Screening - Comments:: Ringing in ears Vision Screening - Comments:: Wears eyeglasses/UTD/ Immunizations and Health Maintenance Health Maintenance  Topic Date Due   Hepatitis C Screening  Never done   Colonoscopy  Never done   Bone Density Scan  Never done   Influenza Vaccine  07/13/2024   COVID-19 Vaccine (5 - 2025-26 season) 08/13/2024   Mammogram  11/20/2024   Medicare Annual Wellness (AWV)  11/16/2025   DTaP/Tdap/Td (2 - Td or Tdap) 07/03/2031    Pneumococcal Vaccine: 50+ Years  Completed   Zoster Vaccines- Shingrix  Completed   Meningococcal B Vaccine  Aged Out        Assessment/Plan:  This is a routine wellness examination for Elma.  Patient Care Team: Verdia Lombard, MD as PCP - [facility]  I have personally reviewed and noted the following in the patient's chart:   Medical and social history Use of alcohol, tobacco or illicit drugs  Current medications and supplements including opioid prescriptions. Functional ability and status Nutritional status Physical activity Advanced directives List of other physicians Hospitalizations, surgeries, and ER visits in previous 12 months Vitals Screenings to include cognitive, depression, and falls Referrals and appointments  No orders of the defined types were placed in this encounter.  In addition, I have reviewed and discussed with patient certain preventive protocols, quality metrics, and best practice recommendations. A written personalized care plan for preventive services as well as general preventive health recommendations were provided to patient.   Helayne Metsker L Michal Callicott, CMA   11/16/2024   Return in 1 year (on 11/16/2025).  After Visit Summary: (MyChart) Due to this being a telephonic visit, the after visit summary with patients personalized plan was offered to patient via MyChart   Nurse Notes: Patient is due for a Cologuard, a DEXA and a Hep C screening.  Patient will have a TOC visit with Corean on 12/11/2024 and would like to discuss all due screenings at that time.  She had no other concerns to address today.

## 2024-11-21 ENCOUNTER — Ambulatory Visit
Admission: RE | Admit: 2024-11-21 | Discharge: 2024-11-21 | Disposition: A | Discharge status: Home / Self Care | Source: Ambulatory Visit | Attending: Family Medicine | Admitting: Family Medicine

## 2024-11-21 DIAGNOSIS — Z1231 Encounter for screening mammogram for malignant neoplasm of breast: Secondary | ICD-10-CM

## 2024-12-11 ENCOUNTER — Encounter: Payer: Self-pay | Admitting: Family Medicine

## 2024-12-11 ENCOUNTER — Ambulatory Visit (INDEPENDENT_AMBULATORY_CARE_PROVIDER_SITE_OTHER): Admitting: Family Medicine

## 2024-12-11 VITALS — BP 126/80 | HR 64 | Temp 98.1°F | Ht 63.0 in | Wt 158.2 lb

## 2024-12-11 DIAGNOSIS — R7303 Prediabetes: Secondary | ICD-10-CM | POA: Diagnosis not present

## 2024-12-11 DIAGNOSIS — I10 Essential (primary) hypertension: Secondary | ICD-10-CM | POA: Diagnosis not present

## 2024-12-11 DIAGNOSIS — S76319A Strain of muscle, fascia and tendon of the posterior muscle group at thigh level, unspecified thigh, initial encounter: Secondary | ICD-10-CM

## 2024-12-11 DIAGNOSIS — Z7689 Persons encountering health services in other specified circumstances: Secondary | ICD-10-CM | POA: Diagnosis not present

## 2024-12-11 DIAGNOSIS — Z79899 Other long term (current) drug therapy: Secondary | ICD-10-CM

## 2024-12-11 DIAGNOSIS — G43109 Migraine with aura, not intractable, without status migrainosus: Secondary | ICD-10-CM | POA: Insufficient documentation

## 2024-12-11 DIAGNOSIS — E782 Mixed hyperlipidemia: Secondary | ICD-10-CM | POA: Diagnosis not present

## 2024-12-11 DIAGNOSIS — G8929 Other chronic pain: Secondary | ICD-10-CM

## 2024-12-11 DIAGNOSIS — Z780 Asymptomatic menopausal state: Secondary | ICD-10-CM | POA: Insufficient documentation

## 2024-12-11 DIAGNOSIS — M545 Low back pain, unspecified: Secondary | ICD-10-CM | POA: Diagnosis not present

## 2024-12-11 DIAGNOSIS — C50412 Malignant neoplasm of upper-outer quadrant of left female breast: Secondary | ICD-10-CM | POA: Insufficient documentation

## 2024-12-11 DIAGNOSIS — Z1211 Encounter for screening for malignant neoplasm of colon: Secondary | ICD-10-CM

## 2024-12-11 DIAGNOSIS — Z1159 Encounter for screening for other viral diseases: Secondary | ICD-10-CM | POA: Diagnosis not present

## 2024-12-11 LAB — COMPREHENSIVE METABOLIC PANEL WITH GFR
ALT: 22 U/L (ref 3–35)
AST: 24 U/L (ref 5–37)
Albumin: 4.5 g/dL (ref 3.5–5.2)
Alkaline Phosphatase: 65 U/L (ref 39–117)
BUN: 16 mg/dL (ref 6–23)
CO2: 30 meq/L (ref 19–32)
Calcium: 10 mg/dL (ref 8.4–10.5)
Chloride: 101 meq/L (ref 96–112)
Creatinine, Ser: 0.66 mg/dL (ref 0.40–1.20)
GFR: 85.84 mL/min
Glucose, Bld: 94 mg/dL (ref 70–99)
Potassium: 3.9 meq/L (ref 3.5–5.1)
Sodium: 137 meq/L (ref 135–145)
Total Bilirubin: 0.7 mg/dL (ref 0.2–1.2)
Total Protein: 7.9 g/dL (ref 6.0–8.3)

## 2024-12-11 LAB — VITAMIN B12: Vitamin B-12: 921 pg/mL — ABNORMAL HIGH (ref 211–911)

## 2024-12-11 LAB — LIPID PANEL
Cholesterol: 158 mg/dL (ref 28–200)
HDL: 40.1 mg/dL
LDL Cholesterol: 77 mg/dL (ref 10–99)
NonHDL: 118.38
Total CHOL/HDL Ratio: 4
Triglycerides: 209 mg/dL — ABNORMAL HIGH (ref 10.0–149.0)
VLDL: 41.8 mg/dL — ABNORMAL HIGH (ref 0.0–40.0)

## 2024-12-11 LAB — CBC WITH DIFFERENTIAL/PLATELET
Basophils Absolute: 0 K/uL (ref 0.0–0.1)
Basophils Relative: 0.3 % (ref 0.0–3.0)
Eosinophils Absolute: 0.1 K/uL (ref 0.0–0.7)
Eosinophils Relative: 1.2 % (ref 0.0–5.0)
HCT: 41.6 % (ref 36.0–46.0)
Hemoglobin: 14.1 g/dL (ref 12.0–15.0)
Lymphocytes Relative: 28.3 % (ref 12.0–46.0)
Lymphs Abs: 2.1 K/uL (ref 0.7–4.0)
MCHC: 33.8 g/dL (ref 30.0–36.0)
MCV: 88.8 fl (ref 78.0–100.0)
Monocytes Absolute: 0.6 K/uL (ref 0.1–1.0)
Monocytes Relative: 8.7 % (ref 3.0–12.0)
Neutro Abs: 4.5 K/uL (ref 1.4–7.7)
Neutrophils Relative %: 61.5 % (ref 43.0–77.0)
Platelets: 198 K/uL (ref 150.0–400.0)
RBC: 4.69 Mil/uL (ref 3.87–5.11)
RDW: 14.2 % (ref 11.5–15.5)
WBC: 7.3 K/uL (ref 4.0–10.5)

## 2024-12-11 LAB — VITAMIN D 25 HYDROXY (VIT D DEFICIENCY, FRACTURES): VITD: 31.45 ng/mL (ref 30.00–100.00)

## 2024-12-11 LAB — MICROALBUMIN / CREATININE URINE RATIO
Creatinine,U: 46.1 mg/dL
Microalb Creat Ratio: 239.3 mg/g — ABNORMAL HIGH (ref 0.0–30.0)
Microalb, Ur: 11 mg/dL — ABNORMAL HIGH (ref 0.7–1.9)

## 2024-12-11 LAB — HEMOGLOBIN A1C: Hgb A1c MFr Bld: 6 % (ref 4.6–6.5)

## 2024-12-11 LAB — TSH: TSH: 0.35 u[IU]/mL (ref 0.35–5.50)

## 2024-12-11 NOTE — Progress Notes (Signed)
 "  New Patient Visit  Subjective:     Patient ID: Jennifer Chan, female    DOB: [birth year], 73 y.o.   MRN: [id number]  No chief complaint on file.   HPI  Discussed the use of AI scribe software for clinical note transcription with the patient, who gave verbal consent to proceed.  History of Present Illness Jennifer Chan ERNIE is a 73 year old female who presents for establishment of care and evaluation of muscle pain following a fall.  Musculoskeletal pain following fall - Fell down several flights of stairs - Developed significant hamstring and low back pain after the fall - Hamstring pain radiates up the legs - No swelling or bruising observed - Stretching has not relieved symptoms - Did not seek immediate medical evaluation or imaging at the time of injury - Used Tylenol, ibuprofen, and topical creams for pain management - Pain was intense for approximately two weeks and limited mobility, especially while caring for her husband and his mother - Low back pain has improved, but persistent leg symptoms remain  Headache and migraine symptoms - Experiences tightness in the back of the head associated with onset of migraines - Uses sumatriptan for migraine relief - Previous sumatriptan prescription discontinued due to unavailability  Peripheral neurological symptoms - No regular swelling in hands or feet - Occasional numbness in feet at night, resolves by morning     ROS Per HPI  Outpatient Encounter Medications as of 12/11/2024  Medication Sig   amLODipine  (NORVASC ) 10 MG tablet Take 1 tablet (10 mg total) by mouth daily.   metoprolol  succinate (TOPROL -XL) 100 MG 24 hr tablet Take 1 tablet (100 mg total) by mouth daily. Take with or immediately following a meal.   olmesartan-hydrochlorothiazide (BENICAR HCT) 20-12.5 MG tablet Take 1 tablet by mouth daily.   pravastatin  (PRAVACHOL ) 40 MG tablet Take 1 tablet (40 mg total) by mouth daily.   PREVNAR 20 0.5 ML  injection    SHINGRIX injection    Tragacanth (ASTRAGALUS ROOT) POWD 500 mg by Does not apply route daily.   Vitamin D3 (VITAMIN D) 25 MCG tablet Take 1 tablet (1,000 Units total) by mouth daily.   SUMAtriptan (IMITREX) 100 MG tablet Take 100 mg by mouth every 2 (two) hours as needed for migraine or headache.   No facility-administered encounter medications on file as of 12/11/2024.    Past Medical History:  Diagnosis Date   Anxiety    Arthritis 2021   Breast cancer (HCC) 2017   Left Breast Cancer   Cancer (HCC) 2017   GERD (gastroesophageal reflux disease) 2017   Hyperlipidemia 2012   Hypertension 2012   Personal history of chemotherapy 2017   Left Breast Cancer   Personal history of radiation therapy 2017   Left Breast Cancer    Past Surgical History:  Procedure Laterality Date   ABDOMINAL HYSTERECTOMY  1993   comlete   BREAST EXCISIONAL BIOPSY Left    BREAST EXCISIONAL BIOPSY Left    BREAST LUMPECTOMY Left 2017    Family History  Problem Relation Age of Onset   Alcohol abuse Mother    Non-Hodgkin's lymphoma Mother    Heart disease Mother    Hyperlipidemia Mother    Hypertension Mother    Hyperlipidemia Father    Hypertension Father    Alcohol abuse Sister    Hyperlipidemia Sister    Hypertension Sister    Alcohol abuse Brother    Hyperlipidemia Brother    Hypertension Brother  Stroke Brother    Alcohol abuse Maternal Grandmother    Alcohol abuse Maternal Grandfather    Alcohol abuse Paternal Grandmother    Alcohol abuse Paternal Grandfather     Social History   Socioeconomic History   Marital status: Married    Spouse name: Jennifer Chan   Number of children: Not on file   Years of education: Not on file   Highest education level: 12th grade  Occupational History   Occupation: RETIRED  Tobacco Use   Smoking status: Former    Current packs/day: 0.00    Types: Cigarettes    Quit date: 12/14/2010    Years since quitting: 14.0   Smokeless tobacco: Never    Tobacco comments:    No  Vaping Use   Vaping status: Never Used  Substance and Sexual Activity   Alcohol use: Never   Drug use: Never   Sexual activity: Yes    Birth control/protection: None    Comment: No  Other Topics Concern   Not on file  Social History Narrative   Patient recently moved here from [place]  because she wanted to be in a more city-like area. She reports where she used to live, she had to drive hours to get to a store.  She lives with her husband Jennifer Chan).  She owns a car.  She does not have any religious or personal beliefs that affect her health care.  She does not have an advanced directive.  Her husband and brothers would make medical decisions for her if she was unable to do so.  She does exercise regularly: Walking.  She used to use cigarettes and quit in January 2012.  She denies any recreational drug use and alcohol use.  She feels safe in her relationship.      Lives with husband and grandson/2025   Social Drivers of Health   Tobacco Use: Medium Risk (12/11/2024)   Patient History    Smoking Tobacco Use: Former    Smokeless Tobacco Use: Never    Passive Exposure: Not on file  Financial Resource Strain: Low Risk (12/10/2024)   Overall Financial Resource Strain (CARDIA)    Difficulty of Paying Living Expenses: Not hard at all  Food Insecurity: No Food Insecurity (12/10/2024)   Epic    Worried About Programme Researcher, Broadcasting/film/video in the Last Year: Never true    Ran Out of Food in the Last Year: Never true  Transportation Needs: No Transportation Needs (12/10/2024)   Epic    Lack of Transportation (Medical): No    Lack of Transportation (Non-Medical): No  Physical Activity: Insufficiently Active (12/10/2024)   Exercise Vital Sign    Days of Exercise per Week: 3 days    Minutes of Exercise per Session: 30 min  Stress: Stress Concern Present (12/10/2024)   [employer] of Occupational Health - Occupational Stress Questionnaire    Feeling  of Stress: To some extent  Social Connections: Moderately Integrated (12/10/2024)   Social Connection and Isolation Panel    Frequency of Communication with Friends and Family: More than three times a week    Frequency of Social Gatherings with Friends and Family: Once a week    Attends Religious Services: More than 4 times per year    Active Member of [employer] or Organizations: No    Attends Banker Meetings: Not on file    Marital Status: Married  Intimate Partner Violence: Not At Risk (11/16/2024)   Epic    Fear of  Current or Ex-Partner: No    Emotionally Abused: No    Physically Abused: No    Sexually Abused: No  Depression (PHQ2-9): Low Risk (11/16/2024)   Depression (PHQ2-9)    PHQ-2 Score: 3  Alcohol Screen: Not on file  Housing: Low Risk (12/10/2024)   Epic    Unable to Pay for Housing in the Last Year: No    Number of Times Moved in the Last Year: 0    Homeless in the Last Year: No  Utilities: Not At Risk (11/16/2024)   Epic    Threatened with loss of utilities: No  Health Literacy: Adequate Health Literacy (11/16/2024)   B1300 Health Literacy    Frequency of need for help with medical instructions: Never       Objective:    BP 126/80 (BP Location: Left Arm, Patient Position: Sitting)   Pulse 64   Temp 98.1 F (36.7 C) (Temporal)   Ht 5' 3 (1.6 m)   Wt 158 lb 3.2 oz (71.8 kg)   LMP  (LMP Unknown)   SpO2 99%   BMI 28.02 kg/m    Physical Exam Vitals and nursing note reviewed.  Constitutional:      General: She is not in acute distress.    Appearance: Normal appearance.  HENT:     Head: Normocephalic and atraumatic.     Right Ear: External ear normal.     Left Ear: External ear normal.     Nose: Nose normal.     Mouth/Throat:     Mouth: Mucous membranes are moist.     Pharynx: Oropharynx is clear.  Eyes:     Extraocular Movements: Extraocular movements intact.     Pupils: Pupils are equal, round, and reactive to light.  Cardiovascular:      Rate and Rhythm: Normal rate and regular rhythm.     Pulses: Normal pulses.     Heart sounds: Normal heart sounds.  Pulmonary:     Effort: Pulmonary effort is normal. No respiratory distress.     Breath sounds: Normal breath sounds. No wheezing, rhonchi or rales.  Musculoskeletal:        General: Normal range of motion.     Cervical back: Normal range of motion.     Right lower leg: No edema.     Left lower leg: No edema.  Lymphadenopathy:     Cervical: No cervical adenopathy.  Neurological:     General: No focal deficit present.     Mental Status: She is alert and oriented to person, place, and time.  Psychiatric:        Mood and Affect: Mood normal.        Thought Content: Thought content normal.     No results found for any visits on 12/11/24.      Assessment & Plan:   Assessment and Plan Assessment & Plan Establish care Routine health maintenance up to date on vaccinations.  Low back and hamstring pain after fall Persistent pain post-fall, likely related to low back issues. No imaging performed. - Provided exercises for hamstring and low back stretching. - Advised to report if exercises ineffective.  Migraine with aura without status migrainosus, not intractable  Intermittent head tightness indicative of migraine aura.  - Refilled sumatriptan. - Chronic, stable  Essential hypertension - Chronic, stable -Controlled with amlodipine , metoprolol , Benicar  Mixed hyperlipidemia - chronic, stable - continue pravastatin   Prediabetes - Continue low sugar, low-carb diet -CMP, A1c today  Postmenopausal estrogen deficiency - Due  for bone density, ordered  Colon cancer screening -Cologuard ordered  Need for hepatitis C screening - Hep C screening ordered   Medication Management - labs today, will dose adjust medications as indicated      Orders Placed This Encounter  Procedures   DG Bone Density [[id number]]    Standing Status:   Future    Expiration  Date:   12/11/2025    Reason for Exam (SYMPTOM  OR DIAGNOSIS REQUIRED):   postmenopausal estrogen def    Preferred imaging location?:   [facility]   Cologuard   CBC with Differential/Platelet    Release to patient:   Immediate [1]   Comprehensive metabolic panel with GFR    Release to patient:   Immediate [1]   Hemoglobin A1c   Lipid panel   Microalbumin / creatinine urine ratio    Release to patient:   Immediate   VITAMIN D 25 Hydroxy (Vit-D Deficiency, Fractures)   Vitamin B12   TSH   Hepatitis C Antibody     No orders of the defined types were placed in this encounter.   Return in about 6 months (around 06/11/2025) for meds OV.  Corean LITTIE Ku, FNP   "

## 2024-12-11 NOTE — Patient Instructions (Addendum)
 Welcome to [facility]!  Thank you for choosing us  for your Primary Care needs.   We offer in person and video appointments for your convenience. You may call our office to schedule appointments, or you may schedule appointments with me through MyChart.   The best way to get in contact with me is via MyChart message. This will get to me faster than a phone call, unless there is an emergency, then please call 911.  The lab is located downstairs in the Sports Medicine building, we also have xray available there.   Continue current medication regimen.   I have ordered a bone density scan for you today. Someone will be reaching out to get you scheduled.   I have ordered a Cologuard for colon cancer screening for you. This will come to your house in the mail, please follow the directions and send specimen back through the mail.  Will be in contact with results once they are received.  We are checking labs today, will be in contact with any results that require further attention  Follow-up with me in 6 mos for medication management, sooner if needed.

## 2024-12-12 LAB — HEPATITIS C ANTIBODY: Hepatitis C Ab: NONREACTIVE

## 2024-12-14 ENCOUNTER — Ambulatory Visit: Payer: Self-pay | Admitting: Family Medicine

## 2024-12-17 ENCOUNTER — Telehealth: Payer: Self-pay

## 2024-12-17 NOTE — Telephone Encounter (Signed)
 Copied from CRM [id number]. Topic: Clinical - Medical Advice >> Dec 17, 2024 11:51 AM Rea ORN wrote: Reason for CRM: Pt stated PCP gave her exercises to do at her last appt. Pt can she can't print them from MyChart and is asking for them to be printed and mailed to her home. Please mail to:  [address] [place] [address]  Please call back if there are any additional questions, [phone]

## 2024-12-19 NOTE — Telephone Encounter (Signed)
 Placed in mail

## 2024-12-25 LAB — COLOGUARD: COLOGUARD: NEGATIVE

## 2024-12-28 ENCOUNTER — Other Ambulatory Visit: Payer: Self-pay | Admitting: Family Medicine

## 2024-12-28 DIAGNOSIS — G8929 Other chronic pain: Secondary | ICD-10-CM

## 2025-01-23 ENCOUNTER — Other Ambulatory Visit (HOSPITAL_BASED_OUTPATIENT_CLINIC_OR_DEPARTMENT_OTHER)

## 2025-01-24 ENCOUNTER — Ambulatory Visit (HOSPITAL_BASED_OUTPATIENT_CLINIC_OR_DEPARTMENT_OTHER): Admitting: Physical Therapy

## 2025-01-28 ENCOUNTER — Encounter (HOSPITAL_BASED_OUTPATIENT_CLINIC_OR_DEPARTMENT_OTHER): Payer: Self-pay

## 2025-02-04 ENCOUNTER — Encounter (HOSPITAL_BASED_OUTPATIENT_CLINIC_OR_DEPARTMENT_OTHER): Payer: Self-pay

## 2025-06-11 ENCOUNTER — Ambulatory Visit: Admitting: Family Medicine
